# Patient Record
Sex: Male | Born: 1970 | Race: White | Hispanic: No | Smoking: Never smoker
Health system: Southern US, Community
[De-identification: ages and names within clinical notes are randomized; demographics above are authoritative.]

## PROBLEM LIST (undated history)

## (undated) DIAGNOSIS — G8929 Other chronic pain: Secondary | ICD-10-CM

## (undated) DIAGNOSIS — M4306 Spondylolysis, lumbar region: Secondary | ICD-10-CM

## (undated) DIAGNOSIS — F32A Depression, unspecified: Secondary | ICD-10-CM

## (undated) DIAGNOSIS — M8700 Idiopathic aseptic necrosis of unspecified bone: Secondary | ICD-10-CM

## (undated) DIAGNOSIS — F419 Anxiety disorder, unspecified: Secondary | ICD-10-CM

## (undated) DIAGNOSIS — N529 Male erectile dysfunction, unspecified: Secondary | ICD-10-CM

## (undated) DIAGNOSIS — I10 Essential (primary) hypertension: Secondary | ICD-10-CM

## (undated) DIAGNOSIS — K759 Inflammatory liver disease, unspecified: Secondary | ICD-10-CM

## (undated) DIAGNOSIS — M1990 Unspecified osteoarthritis, unspecified site: Secondary | ICD-10-CM

## (undated) HISTORY — PX: HIP SURGERY: SHX245

## (undated) HISTORY — DX: Anxiety disorder, unspecified: F41.9

## (undated) HISTORY — PX: BACK SURGERY: SHX140

## (undated) HISTORY — DX: Other chronic pain: G89.29

## (undated) HISTORY — DX: Spondylolysis, lumbar region: M43.06

## (undated) HISTORY — DX: Male erectile dysfunction, unspecified: N52.9

## (undated) HISTORY — PX: JOINT REPLACEMENT: SHX530

## (undated) HISTORY — DX: Depression, unspecified: F32.A

---

## 2005-02-26 ENCOUNTER — Emergency Department: Payer: Self-pay | Admitting: General Practice

## 2007-07-06 ENCOUNTER — Emergency Department: Payer: Self-pay | Admitting: Emergency Medicine

## 2009-04-28 ENCOUNTER — Emergency Department: Payer: Self-pay | Admitting: Emergency Medicine

## 2010-01-05 ENCOUNTER — Ambulatory Visit: Payer: Self-pay | Admitting: Orthopedic Surgery

## 2010-01-10 ENCOUNTER — Inpatient Hospital Stay: Payer: Self-pay | Admitting: Orthopedic Surgery

## 2011-11-23 ENCOUNTER — Emergency Department: Payer: Self-pay | Admitting: Emergency Medicine

## 2011-11-23 ENCOUNTER — Ambulatory Visit: Payer: Self-pay | Admitting: Internal Medicine

## 2011-11-23 LAB — CBC
HCT: 34.3 % — ABNORMAL LOW (ref 40.0–52.0)
HGB: 11.6 g/dL — ABNORMAL LOW (ref 13.0–18.0)
MCH: 30.9 pg (ref 26.0–34.0)
MCHC: 33.9 g/dL (ref 32.0–36.0)
MCV: 91 fL (ref 80–100)
Platelet: 350 10*3/uL (ref 150–440)
RBC: 3.76 10*6/uL — ABNORMAL LOW (ref 4.40–5.90)
RDW: 14 % (ref 11.5–14.5)
WBC: 12.7 10*3/uL — ABNORMAL HIGH (ref 3.8–10.6)

## 2011-11-23 LAB — BASIC METABOLIC PANEL
BUN: 28 mg/dL — ABNORMAL HIGH (ref 7–18)
Calcium, Total: 9.3 mg/dL (ref 8.5–10.1)
Chloride: 95 mmol/L — ABNORMAL LOW (ref 98–107)
EGFR (Non-African Amer.): >60
Osmolality: 278 (ref 275–301)
Potassium: 4 mmol/L (ref 3.5–5.1)
Sodium: 135 mmol/L — ABNORMAL LOW (ref 136–145)

## 2011-11-23 LAB — TROPONIN I: Troponin-I: <0.02 ng/mL

## 2011-12-19 ENCOUNTER — Ambulatory Visit: Payer: Self-pay | Admitting: Internal Medicine

## 2012-09-24 ENCOUNTER — Ambulatory Visit: Payer: Self-pay | Admitting: Family Medicine

## 2015-04-25 ENCOUNTER — Emergency Department
Admission: EM | Admit: 2015-04-25 | Discharge: 2015-04-25 | Disposition: A | Discharge status: Home / Self Care | Payer: Self-pay | Attending: Emergency Medicine | Admitting: Emergency Medicine

## 2015-04-25 ENCOUNTER — Encounter: Payer: Self-pay | Admitting: *Deleted

## 2015-04-25 DIAGNOSIS — F10120 Alcohol abuse with intoxication, uncomplicated: Secondary | ICD-10-CM | POA: Insufficient documentation

## 2015-04-25 DIAGNOSIS — F10920 Alcohol use, unspecified with intoxication, uncomplicated: Secondary | ICD-10-CM

## 2015-04-25 DIAGNOSIS — F1410 Cocaine abuse, uncomplicated: Secondary | ICD-10-CM | POA: Insufficient documentation

## 2015-04-25 DIAGNOSIS — F1210 Cannabis abuse, uncomplicated: Secondary | ICD-10-CM | POA: Insufficient documentation

## 2015-04-25 LAB — CBC WITH DIFFERENTIAL/PLATELET
BASOS ABS: 0.1 10*3/uL (ref 0–0.1)
BASOS PCT: 1 %
EOS ABS: 0.1 10*3/uL (ref 0–0.7)
Eosinophils Relative: 1 %
HEMATOCRIT: 48.1 % (ref 40.0–52.0)
HEMOGLOBIN: 16.6 g/dL (ref 13.0–18.0)
Lymphocytes Relative: 30 %
Lymphs Abs: 2.2 10*3/uL (ref 1.0–3.6)
MCH: 34.6 pg — ABNORMAL HIGH (ref 26.0–34.0)
MCHC: 34.5 g/dL (ref 32.0–36.0)
MCV: 100.5 fL — ABNORMAL HIGH (ref 80.0–100.0)
MONO ABS: 0.5 10*3/uL (ref 0.2–1.0)
MONOS PCT: 7 %
Neutro Abs: 4.7 10*3/uL (ref 1.4–6.5)
Neutrophils Relative %: 61 %
Platelets: 174 10*3/uL (ref 150–440)
RBC: 4.79 MIL/uL (ref 4.40–5.90)
RDW: 13.9 % (ref 11.5–14.5)
WBC: 7.6 10*3/uL (ref 3.8–10.6)

## 2015-04-25 LAB — COMPREHENSIVE METABOLIC PANEL
ALK PHOS: 74 U/L (ref 38–126)
ALT: 145 U/L — AB (ref 17–63)
ANION GAP: 14 (ref 5–15)
AST: 138 U/L — ABNORMAL HIGH (ref 15–41)
Albumin: 4.9 g/dL (ref 3.5–5.0)
BUN: 7 mg/dL (ref 6–20)
CALCIUM: 9.4 mg/dL (ref 8.9–10.3)
CO2: 26 mmol/L (ref 22–32)
Chloride: 106 mmol/L (ref 101–111)
Creatinine, Ser: 0.76 mg/dL (ref 0.61–1.24)
GFR calc Af Amer: >60 mL/min (ref >60)
GFR calc non Af Amer: >60 mL/min (ref >60)
GLUCOSE: 106 mg/dL — AB (ref 65–99)
Potassium: 3.8 mmol/L (ref 3.5–5.1)
SODIUM: 146 mmol/L — AB (ref 135–145)
Total Bilirubin: 0.4 mg/dL (ref 0.3–1.2)
Total Protein: 9.1 g/dL — ABNORMAL HIGH (ref 6.5–8.1)

## 2015-04-25 LAB — URINE DRUG SCREEN, QUALITATIVE (ARMC ONLY)
Amphetamines, Ur Screen: NOT DETECTED
BARBITURATES, UR SCREEN: NOT DETECTED
Benzodiazepine, Ur Scrn: NOT DETECTED
CANNABINOID 50 NG, UR ~~LOC~~: POSITIVE — AB
Cocaine Metabolite,Ur ~~LOC~~: POSITIVE — AB
MDMA (Ecstasy)Ur Screen: NOT DETECTED
Methadone Scn, Ur: NOT DETECTED
Opiate, Ur Screen: NOT DETECTED
Phencyclidine (PCP) Ur S: NOT DETECTED
Tricyclic, Ur Screen: NOT DETECTED

## 2015-04-25 NOTE — Discharge Instructions (Signed)
You have been seen in the Emergency Department (ED) today for alcohol intoxication and substance abuse.  He has been released from police custody and had no acute medical issues at this time.  Please return to the ED immediately if you have ANY thoughts of hurting yourself or anyone else, so that we may help you.  Please avoid alcohol and drug use.  Follow up with your doctor and/or therapist as soon as possible regarding today's ED  visit.   Please follow up any other recommendations and clinic appointments provided by the psychiatry team that saw you in the Emergency Department.   Alcohol Intoxication Alcohol intoxication occurs when you drink enough alcohol that it affects your ability to function. It can be mild or very severe. Drinking a lot of alcohol in a short time is called binge drinking. This can be very harmful. Drinking alcohol can also be more dangerous if you are taking medicines or other drugs. Some of the effects caused by alcohol may include:  Loss of coordination.  Changes in mood and behavior.  Unclear thinking.  Trouble talking (slurred speech).  Throwing up (vomiting).  Confusion.  Slowed breathing.  Twitching and shaking (seizures).  Loss of consciousness. HOME CARE  Do not drive after drinking alcohol.  Drink enough water and fluids to keep your pee (urine) clear or pale yellow. Avoid caffeine.  Only take medicine as told by your doctor. GET HELP IF:  You throw up (vomit) many times.  You do not feel better after a few days.  You frequently have alcohol intoxication. Your doctor can help decide if you should see a substance use treatment counselor. GET HELP RIGHT AWAY IF:  You become shaky when you stop drinking.  You have twitching and shaking.  You throw up blood. It may look bright red or like coffee grounds.  You notice blood in your poop (bowel movements).  You become lightheaded or pass out (faint). MAKE SURE YOU:   Understand  these instructions.  Will watch your condition.  Will get help right away if you are not doing well or get worse. Document Released: 04/02/2008 Document Revised: 06/17/2013 Document Reviewed: 03/20/2013 [facility] Patient Information 2015 [place], [place]. This information is not intended to replace advice given to you by your health care provider. Make sure you discuss any questions you have with your health care provider.  Ethanol This is a test of the blood alcohol level and is used to determine whether your level will impair your driving or other activities and also to determine the possibility of overdose (alcohol poisoning). PREPARATION FOR TEST No preparation or fasting is necessary. NORMAL FINDINGS  Negative  Possible critical values are greater than 300 mg/dl or greater than 65 mmol/L (SI units). Ranges for normal findings may vary among different laboratories and hospitals. You should always check with your doctor after having lab work or other tests done to discuss the meaning of your test results and whether your values are considered within normal limits. MEANING OF TEST  Your caregiver will go over the test results with you and discuss the importance and meaning of your results, as well as treatment options and the need for additional tests if necessary. OBTAINING THE TEST RESULTS  It is your responsibility to obtain your test results. Ask the lab or department performing the test when and how you will get your results. Document Released: 11/07/2004 Document Revised: 01/07/2012 Document Reviewed: 09/24/2008 [facility] Patient Information 2015 [place], [place]. This information is not intended  to replace advice given to you by your health care provider. Make sure you discuss any questions you have with your health care provider.  Polysubstance Abuse When people abuse more than one drug or type of drug it is called polysubstance or polydrug abuse. For example, many smokers also drink  alcohol. This is one form of polydrug abuse. Polydrug abuse also refers to the use of a drug to counteract an unpleasant effect produced by another drug. It may also be used to help with withdrawal from another drug. People who take stimulants may become agitated. Sometimes this agitation is countered with a tranquilizer. This helps protect against the unpleasant side effects. Polydrug abuse also refers to the use of different drugs at the same time.  Anytime drug use is interfering with normal living activities, it has become abuse. This includes problems with family and friends. Psychological dependence has developed when your mind tells you that the drug is needed. This is usually followed by physical dependence which has developed when continuing increases of drug are required to get the same feeling or "high". This is known as addiction or chemical dependency. A person's risk is much higher if there is a history of chemical dependency in the family. SIGNS OF CHEMICAL DEPENDENCY  You have been told by friends or family that drugs have become a problem.  You fight when using drugs.  You are having blackouts (not remembering what you do while using).  You feel sick from using drugs but continue using.  You lie about use or amounts of drugs (chemicals) used.  You need chemicals to get you going.  You are suffering in work performance or in school because of drug use.  You get sick from use of drugs but continue to use anyway.  You need drugs to relate to people or feel comfortable in social situations.  You use drugs to forget problems. "Yes" answered to any of the above signs of chemical dependency indicates there are problems. The longer the use of drugs continues, the greater the problems will become. If there is a family history of drug or alcohol use, it is best not to experiment with these drugs. Continual use leads to tolerance. After tolerance develops more of the drug is needed to  get the same feeling. This is followed by addiction. With addiction, drugs become the most important part of life. It becomes more important to take drugs than participate in the other usual activities of life. This includes relating to friends and family. Addiction is followed by dependency. Dependency is a condition where drugs are now needed not just to get high, but to feel normal. Addiction cannot be cured but it can be stopped. This often requires outside help and the care of professionals. Treatment centers are listed in the yellow pages under: Cocaine, Narcotics, and Alcoholics Anonymous. Most hospitals and clinics can refer you to a specialized care center. Talk to your caregiver if you need help. Document Released: 06/06/2005 Document Revised: 01/07/2012 Document Reviewed: 10/15/2005 [facility] Patient Information 2015 [place], [place]. This information is not intended to replace advice given to you by your health care provider. Make sure you discuss any questions you have with your health care provider.

## 2015-04-25 NOTE — ED Provider Notes (Signed)
[facility] Emergency Department Provider Note  ____________________________________________  Time seen: Approximately 5:24 AM  I have reviewed the triage vital signs and the nursing notes.   HISTORY  Chief Complaint Medical Clearance  Presumed alcohol intoxication  HPI Phillip Mcdaniel is a 44 y.o. male with history of chronic alcohol abuse and cocaine abuse who presents in police custody for medical clearance to go to jail.  He had a positive breathalyzer test and per their protocol he was brought to the emergency department.  The patient does admit to drinking earlier tonight as well as smoking crack.  He is not sure when he finished.  He drinks a large amount of alcohol every day and occasionally smokes crack.  He denies tobacco abuse.  He denies any chronic medical issues.  No complaints at this time.   History reviewed. No pertinent past medical history.  There are no active problems to display for this patient.   Past Surgical History  Procedure Laterality Date  . Joint replacement      No current outpatient prescriptions on file.  Allergies Review of patient's allergies indicates no known allergies.  History reviewed. No pertinent family history.  Social History History  Substance Use Topics  . Smoking status: Never Smoker   . Smokeless tobacco: Never Used  . Alcohol Use: Yes     Comment: daily, at least 12 beers a day.  Cocaine and marijuana abuse  Review of Systems Constitutional: No fever/chills Eyes: No visual changes. ENT: No sore throat. Cardiovascular: Denies chest pain. Respiratory: Denies shortness of breath. Gastrointestinal: No abdominal pain.  No nausea, no vomiting.  No diarrhea.  No constipation. Genitourinary: Negative for dysuria. Musculoskeletal: Negative for back pain. Skin: Negative for rash. Neurological: Negative for headaches, focal weakness or numbness.  10-point ROS otherwise  negative.  ____________________________________________   PHYSICAL EXAM:  VITAL SIGNS: ED Triage Vitals  Enc Vitals Group     BP 04/25/15 0425 166/109 mmHg     Pulse Rate 04/25/15 0425 90     Resp 04/25/15 0425 16     Temp 04/25/15 0425 97.7 F (36.5 C)     Temp Source 04/25/15 0425 Oral     SpO2 04/25/15 0425 97 %     Weight 04/25/15 0425 170 lb (77.111 kg)     Height 04/25/15 0425 5\' 6"  (1.676 m)     Head Cir --      Peak Flow --      Pain Score --      Pain Loc --      Pain Edu? --      Excl. in GC? --     Constitutional: Alert and oriented, appears mildly intoxicated but is speaking clearly and ambulating without difficulty.  Disheveled but generally well-appearing and in no acute distress. Eyes: Conjunctivae are normal. PERRL. EOMI. Head: Atraumatic. Nose: No congestion/rhinnorhea. Mouth/Throat: Mucous membranes are moist.  Oropharynx non-erythematous. Neck: No stridor.   Cardiovascular: Normal rate, regular rhythm. Grossly normal heart sounds.  Good peripheral circulation. Respiratory: Normal respiratory effort.  No retractions. Lungs CTAB. Gastrointestinal: Soft and nontender. No distention. No abdominal bruits. No CVA tenderness. Musculoskeletal: No lower extremity tenderness nor edema.  No joint effusions. Neurologic:  Normal speech and language. No gross focal neurologic deficits are appreciated. Speech is normal. Skin:  Skin is warm, dry and intact. No rash noted. Psychiatric: Mood and affect are normal. Speech and behavior are normal.  Denies SI/HI  ____________________________________________   LABS (all labs ordered  are listed, but only abnormal results are displayed)  Labs Reviewed  CBC WITH DIFFERENTIAL/PLATELET - Abnormal; Notable for the following:    MCV 100.5 (*)    MCH 34.6 (*)    All other components within normal limits  COMPREHENSIVE METABOLIC PANEL - Abnormal; Notable for the following:    Sodium 146 (*)    Glucose, Bld 106 (*)    Total  Protein 9.1 (*)    AST 138 (*)    ALT 145 (*)    All other components within normal limits  URINE DRUG SCREEN, QUALITATIVE ([facility] ONLY) - Abnormal; Notable for the following:    Cocaine Metabolite,Ur [place] POSITIVE (*)    Cannabinoid 50 Ng, Ur [place] POSITIVE (*)    All other components within normal limits   ____________________________________________  EKG  Not indicated ____________________________________________  RADIOLOGY  Not indicated  ____________________________________________   PROCEDURES  Procedure(s) performed: None  Critical Care performed: No ____________________________________________   INITIAL IMPRESSION / ASSESSMENT AND PLAN / ED COURSE  Pertinent labs & imaging results that were available during my care of the patient were reviewed by me and considered in my medical decision making (see chart for details).  While speaking with the police officers outside of the patient's room, one of the officers received word that the patient has been released from their custody.  The patient is clinically sober given that he is alert and oriented 3, walks with no difficulty, and is completely appropriate during her verbal interactions.  He has a chronic alcoholic and I see little benefit to checking an alcohol level as long as he leaves in the custody of a responsible, sober adult.  The police officers have called his girlfriend, June, who is coming to pick him up.  He is medically cleared and needs no additional care at this time.  ____________________________________________  FINAL CLINICAL IMPRESSION(S) / ED DIAGNOSES  Final diagnoses:  Alcohol intoxication, uncomplicated      NEW MEDICATIONS STARTED DURING THIS VISIT:  New Prescriptions   No medications on file     Loleta Rose, MD 04/25/15 [phone]

## 2015-04-25 NOTE — ED Notes (Signed)
Pt presents in custody of police. Pt needs medical clearance to go to jail because his ETOH measured at the jail was 33.

## 2015-04-25 NOTE — ED Notes (Signed)
Patient brought in by [place] [employer] to be cleared for jail. [place] [facility] [place] not [place] him if his alcohol level is to high.

## 2018-10-24 ENCOUNTER — Emergency Department (HOSPITAL_COMMUNITY): Admission: EM | Admit: 2018-10-24 | Discharge: 2018-10-24 | Discharge status: Absent without leave | Payer: Self-pay

## 2019-01-22 ENCOUNTER — Ambulatory Visit
Admission: EM | Admit: 2019-01-22 | Discharge: 2019-01-22 | Disposition: A | Discharge status: Home / Self Care | Payer: Self-pay | Attending: Family Medicine | Admitting: Family Medicine

## 2019-01-22 ENCOUNTER — Other Ambulatory Visit: Payer: Self-pay

## 2019-01-22 ENCOUNTER — Encounter: Payer: Self-pay | Admitting: Emergency Medicine

## 2019-01-22 DIAGNOSIS — L219 Seborrheic dermatitis, unspecified: Secondary | ICD-10-CM

## 2019-01-22 DIAGNOSIS — J988 Other specified respiratory disorders: Secondary | ICD-10-CM

## 2019-01-22 DIAGNOSIS — B9789 Other viral agents as the cause of diseases classified elsewhere: Secondary | ICD-10-CM

## 2019-01-22 HISTORY — DX: Essential (primary) hypertension: I10

## 2019-01-22 MED ORDER — KETOCONAZOLE 2 % EX CREA: 1.0000 "application " | TOPICAL_CREAM | Freq: Two times a day (BID) | CUTANEOUS | 0 refills | Status: DC

## 2019-01-22 MED ORDER — IPRATROPIUM BROMIDE 0.06 % NA SOLN: 2.0000 | Freq: Four times a day (QID) | NASAL | 0 refills | Status: DC | PRN

## 2019-01-22 MED ORDER — KETOCONAZOLE 2 % EX SHAM: MEDICATED_SHAMPOO | CUTANEOUS | 0 refills | Status: DC

## 2019-01-22 MED ORDER — BENZONATATE 200 MG PO CAPS: 200.0000 mg | ORAL_CAPSULE | Freq: Three times a day (TID) | ORAL | 0 refills | Status: DC | PRN

## 2019-01-22 NOTE — ED Provider Notes (Signed)
[facility]    CSN: [id number] Arrival date & time: 01/22/19  1635  History   Chief Complaint Chief Complaint  Patient presents with  . Cough  . Nasal Congestion    HPI   48 year old male presents with cough and congestion.  Patient reports that his symptoms started earlier this week.  He states that they really started on Tuesday or Wednesday of this week.  Denies fever.  Reports productive cough and congestion.  He has been taking DayQuil and NyQuil without resolution.  Wife has recently had the flu.  He has had no documented fever.  Patient also notes that he has a chronic, ongoing rash of his face.  Affects predominantly the area around the nose, the eyebrows, and the beard.  Rate.  Dry.  No other associated symptoms.  No known exacerbating factors.  No other complaints.  History reviewed and updated as below.  Past Medical History:  Diagnosis Date  . Hypertension    Past Surgical History:  Procedure Laterality Date  . JOINT REPLACEMENT      Home Medications    Prior to Admission medications   Medication Sig Start Date End Date Taking? Authorizing Provider  benzonatate (TESSALON) 200 MG capsule Take 1 capsule (200 mg total) by mouth 3 (three) times daily as needed for cough. 01/22/19   Everlene Other G, DO  ipratropium (ATROVENT) 0.06 % nasal spray Place 2 sprays into both nostrils 4 (four) times daily as needed for rhinitis. 01/22/19   Tommie Sams, DO  ketoconazole (NIZORAL) 2 % cream Apply 1 application topically 2 (two) times daily. 01/22/19   Tommie Sams, DO  ketoconazole (NIZORAL) 2 % shampoo Daily until improvement, then once weekly. 01/22/19   Tommie Sams, DO    Social History Social History   Tobacco Use  . Smoking status: Never Smoker  . Smokeless tobacco: Never Used  Substance Use Topics  . Alcohol use: Yes    Comment: daily, at least 12 beers a day.  . Drug use: Not Currently    Types: Cocaine, Marijuana    Comment: 2 days ago      Allergies   Patient has no known allergies.   Review of Systems Review of Systems  HENT: Positive for congestion.   Respiratory: Positive for cough.   Skin: Positive for rash.   Physical Exam Triage Vital Signs ED Triage Vitals  Enc Vitals Group     BP 01/22/19 1647 (!) 170/139     Pulse Rate 01/22/19 1647 88     Resp 01/22/19 1647 18     Temp 01/22/19 1647 98.4 F (36.9 C)     Temp Source 01/22/19 1647 Oral     SpO2 01/22/19 1647 99 %     Weight 01/22/19 1645 170 lb (77.1 kg)     Height 01/22/19 1645 5\' 6"  (1.676 m)     Head Circumference --      Peak Flow --      Pain Score 01/22/19 1645 0     Pain Loc --      Pain Edu? --      Excl. in GC? --    Updated Vital Signs BP (!) 170/139 (BP Location: Right Arm)   Pulse 88   Temp 98.4 F (36.9 C) (Oral)   Resp 18   Ht 5\' 6"  (1.676 m)   Wt 77.1 kg   SpO2 99%   BMI 27.44 kg/m   Visual Acuity Right Eye Distance:  Left Eye Distance:   Bilateral Distance:    Right Eye Near:   Left Eye Near:    Bilateral Near:     Physical Exam Vitals signs and nursing note reviewed.  Constitutional:      General: He is not in acute distress.    Appearance: Normal appearance.  HENT:     Head: Normocephalic and atraumatic.      Comments: Erythematous, dry rash at the labeled locations.    Nose: Congestion present.     Mouth/Throat:     Pharynx: Oropharynx is clear. No oropharyngeal exudate or posterior oropharyngeal erythema.  Eyes:     General:        Right eye: No discharge.        Left eye: No discharge.     Conjunctiva/sclera: Conjunctivae normal.  Cardiovascular:     Rate and Rhythm: Normal rate and regular rhythm.  Pulmonary:     Effort: Pulmonary effort is normal.     Breath sounds: Normal breath sounds.  Neurological:     Mental Status: He is alert.  Psychiatric:        Mood and Affect: Mood normal.        Behavior: Behavior normal.      UC Treatments / Results  Labs (all labs ordered are listed, but  only abnormal results are displayed) Labs Reviewed - No data to display  EKG None  Radiology No results found.  Procedures Procedures (including critical care time)  Medications Ordered in UC Medications - No data to display  Initial Impression / Assessment and Plan / UC Course  I have reviewed the triage vital signs and the nursing notes.  Pertinent labs & imaging results that were available during my care of the patient were reviewed by me and considered in my medical decision making (see chart for details).    48 year old male presents with a viral respiratory infection.  Tessalon Perles and Atrovent nasal spray as prescribed.  Patient's rash appears to be most consistent with seborrheic dermatitis.  Treating with ketoconazole.  Final Clinical Impressions(s) / UC Diagnoses   Final diagnoses:  Respiratory infection  Seborrheic dermatitis     Discharge Instructions     Medications as prescribed.  Take care  Dr. Adriana Simas    ED Prescriptions    Medication Sig Dispense Auth. Provider   benzonatate (TESSALON) 200 MG capsule Take 1 capsule (200 mg total) by mouth 3 (three) times daily as needed for cough. 30 capsule Jamaurion Slemmer G, DO   ipratropium (ATROVENT) 0.06 % nasal spray Place 2 sprays into both nostrils 4 (four) times daily as needed for rhinitis. 15 mL Delquan Poucher G, DO   ketoconazole (NIZORAL) 2 % cream Apply 1 application topically 2 (two) times daily. 60 g Keeyon Privitera G, DO   ketoconazole (NIZORAL) 2 % shampoo Daily until improvement, then once weekly. 120 mL Tommie Sams, DO     Controlled Substance Prescriptions [place] Controlled Substance Registry consulted? Not Applicable   Tommie Sams, DO 01/22/19 1733

## 2019-01-22 NOTE — ED Triage Notes (Signed)
Patient c/o cough, nasal congestion and chills that started 1 week ago. Patient has been taken OTC Dayquil and Nyquil for his symptoms.  Wife diagnosed with the flu last week

## 2019-01-22 NOTE — Discharge Instructions (Signed)
Medications as prescribed. ° °Take care ° °Dr. Odessa Nishi  °

## 2019-06-23 ENCOUNTER — Other Ambulatory Visit: Payer: Self-pay

## 2019-06-23 DIAGNOSIS — Z20822 Contact with and (suspected) exposure to covid-19: Secondary | ICD-10-CM

## 2019-06-24 LAB — NOVEL CORONAVIRUS, NAA: SARS-CoV-2, NAA: NOT DETECTED

## 2019-06-25 ENCOUNTER — Telehealth: Payer: Self-pay | Admitting: General Practice

## 2019-06-25 NOTE — Telephone Encounter (Signed)
Negative COVID results given. Patient results "NOT Detected." Caller expressed understanding. ° °

## 2020-03-18 ENCOUNTER — Encounter: Payer: Self-pay | Admitting: Emergency Medicine

## 2020-03-18 ENCOUNTER — Ambulatory Visit (INDEPENDENT_AMBULATORY_CARE_PROVIDER_SITE_OTHER): Payer: Self-pay

## 2020-03-18 ENCOUNTER — Other Ambulatory Visit: Payer: Self-pay

## 2020-03-18 ENCOUNTER — Ambulatory Visit
Admission: EM | Admit: 2020-03-18 | Discharge: 2020-03-18 | Disposition: A | Discharge status: Home / Self Care | Payer: Self-pay | Attending: Urgent Care | Admitting: Urgent Care

## 2020-03-18 DIAGNOSIS — M5441 Lumbago with sciatica, right side: Secondary | ICD-10-CM

## 2020-03-18 DIAGNOSIS — M25559 Pain in unspecified hip: Secondary | ICD-10-CM

## 2020-03-18 DIAGNOSIS — G8929 Other chronic pain: Secondary | ICD-10-CM

## 2020-03-18 HISTORY — DX: Idiopathic aseptic necrosis of unspecified bone: M87.00

## 2020-03-18 MED ORDER — KETOROLAC TROMETHAMINE 60 MG/2ML IM SOLN
60.0000 mg | Freq: Once | INTRAMUSCULAR | Status: AC
Start: 2020-03-18 — End: 2020-03-18
  Administered 2020-03-18: 60 mg via INTRAMUSCULAR

## 2020-03-18 MED ORDER — HYDROCODONE-ACETAMINOPHEN 5-325 MG PO TABS: 1.0000 | ORAL_TABLET | Freq: Three times a day (TID) | ORAL | 0 refills | Status: DC | PRN

## 2020-03-18 MED ORDER — METHYLPREDNISOLONE 4 MG PO TBPK
ORAL_TABLET | ORAL | 0 refills | Status: DC
Start: 2020-03-18 — End: 2020-05-03

## 2020-03-18 MED ORDER — DEXAMETHASONE SODIUM PHOSPHATE 10 MG/ML IJ SOLN
10.0000 mg | Freq: Once | INTRAMUSCULAR | Status: AC
  Administered 2020-03-18: 10 mg via INTRAMUSCULAR

## 2020-03-18 NOTE — Discharge Instructions (Signed)
It was very nice seeing you today in clinic. Thank you for entrusting me with your care.   Rest; avoid strenuous activity for the next several days. Stretch as tolerated. Apply heat and/or ice to help with pain.   Make arrangements to follow up with your regular doctor in 1 week for re-evaluation if not improving. If your symptoms/condition worsens, please seek follow up care either here or in the ER. Please remember, our [facility] providers are "right here with you" when you need [place].   Again, it was my pleasure to take care of you today. Thank you for choosing our clinic. I hope that you start to feel better quickly.   Quentin Mulling, MSN, APRN, FNP-C, CEN Advanced Practice Provider [facility]

## 2020-03-18 NOTE — ED Triage Notes (Signed)
Patient c/o pain in his right hip for the past 3 weeks.  Patient states that the pain radiates to his right buttock. Patient denies injury or fall.

## 2020-03-19 ENCOUNTER — Encounter: Payer: Self-pay | Admitting: Urgent Care

## 2020-03-19 NOTE — ED Provider Notes (Signed)
[place], [place]   Name: Phillip Mcdaniel DOB: 08/31/1971 MRN: [id number] CSN: [id number] PCP: Patient, No Pcp Per  Arrival date and time:  03/18/20 2947  Chief Complaint:  Back Pain  NOTE: Prior to seeing the patient today, I have reviewed the triage nursing documentation and vital signs. Clinical staff has updated patient's PMH/PSHx, current medication list, and drug allergies/intolerances to ensure comprehensive history available to assist in medical decision making.   History:   HPI: Phillip Mcdaniel is a 49 y.o. male who presents today with complaints of pain in the RIGHT side of his lower back and RIGHT hip. PMH (+) hip replacement and AVN in his RIGHT hip. Pain present to varying degrees x 3 weeks. Patient denies injury. Patient works a physically demanding job that requires him to do lots of lifting, bending, and stooping. He has not experienced any concurrent urinary symptoms. Patient confirms radiation of his pain into his RIGHT lower extremity; posterior thigh cultimating just superior to the knee. He has not appreciated any weakness in his legs. Patient denies any acute issues with bowel or bladder incontinence. No saddle anesthesia. Patient notes that pain is exacerbated by prolonged sitting/standing, bending, stooping, torso twisting, and heavy lifting. He has attempted conservative management at home using Goody powders, which has done little to improve his symptoms. PMH does not include any previous significant back injuries or surgeries. He presents hypertensive and tachycardic secondary to his pain. He is observed in the floor hyperventilating upon my initial entrance to the room.   Past Medical History:  Diagnosis Date  . AVN (avascular necrosis of bone) ([place])    RIGHT hip  . Hypertension     Past Surgical History:  Procedure Laterality Date  . HIP SURGERY     RIGHT hip replacement    History reviewed. No pertinent family history.  Social History   Tobacco Use  .  Smoking status: Never Smoker  . Smokeless tobacco: Never Used  Substance Use Topics  . Alcohol use: Yes    Comment: daily, at least 12 beers a day.  . Drug use: Not Currently    Types: Cocaine, Marijuana    Comment: 2 days ago    There are no problems to display for this patient.   Home Medications:    No outpatient medications have been marked as taking for the 03/18/20 encounter [facility] Encounter).    Allergies:   Patient has no known allergies.  Review of Systems (ROS):  Review of systems NEGATIVE unless otherwise noted in narrative H&P section.   Vital Signs: Today's Vitals   03/18/20 1017 03/18/20 1018 03/18/20 1020 03/18/20 1143  BP:   (S) (!) 170/104   Pulse:   (!) 113   Resp:   16   Temp:   98.6 F (37 C)   TempSrc:   Oral   SpO2:   98%   Weight:  170 lb (77.1 kg)    Height:  5\' 6"  (1.676 m)    PainSc: 10-Worst pain ever   7     Physical Exam: Physical Exam  Constitutional: He is oriented to person, place, and time. He appears distressed (2/2 pain).  HENT:  Head: Normocephalic and atraumatic.  Eyes: Pupils are equal, round, and reactive to light.  Cardiovascular: Regular rhythm, normal heart sounds and intact distal pulses. Tachycardia present.  Pulmonary/Chest: Effort normal and breath sounds normal. Tachypnea noted.  Musculoskeletal:     Lumbar back: Pain and tenderness present. No swelling, deformity, lacerations  or spasms. Decreased range of motion (2/2 acute pain). Normal pulse.       Back:     Right hip: No swelling, deformity, tenderness or crepitus. Decreased range of motion. Normal strength.     Comments: No midline neck/back pain or gross deformities. (+) PMS noted distally; normal color, temperature, and capillary refill.  Neurological: He is alert and oriented to person, place, and time. He has normal sensation, normal strength and normal reflexes. He has an abnormal Straight Leg Raise Test. Gait (2/2 acute pain) abnormal.  Skin: Skin is  warm and dry. No rash noted. He is not diaphoretic.  Psychiatric: Memory, affect and judgment normal. His mood appears anxious.  Nursing note and vitals reviewed.   Urgent Care Treatments / Results:   Orders Placed This Encounter  Procedures  . DG Lumbar Spine Complete  . DG Hip Unilat W or Wo Pelvis 2-3 Views Right    LABS: PLEASE NOTE: all labs that were ordered this encounter are listed, however only abnormal results are displayed. Labs Reviewed - No data to display  EKG: -None  RADIOLOGY: DG Lumbar Spine Complete  Result Date: 03/18/2020 CLINICAL DATA:  Pain in right lower back with radiation into hip and into right lower extremity, status post total knee replacement on the right, past medical history of AVN. Additional history provided by technologist: Patient reports pain in right hip for the past 3 weeks, radiates to right buttock. EXAM: LUMBAR SPINE - COMPLETE 4+ VIEW COMPARISON:  No pertinent prior studies available for comparison. FINDINGS: Five lumbar vertebrae. Thoracolumbar dextrocurvature. Minimal L1-L2, L2-L3, L3-L4 and L4-L5 grade 1 anterolisthesis. No lumbar compression fracture. Multilevel disc space narrowing. Most notably there is moderate disc space narrowing at L5-S1. Lower lumbar facet arthrosis. No convincing pars interarticularis defect. Partially imaged right hip arthroplasty. IMPRESSION: No lumbar compression fracture. Moderate L5-S1 disc space narrowing. No more than mild disc space narrowing at the remaining levels. Lower lumbar facet arthrosis. Thoracolumbar dextrocurvature. Mild multilevel grade 1 retrolisthesis. Electronically Signed   By: Jackey Loge DO   On: 03/18/2020 11:26   DG Hip Unilat W or Wo Pelvis 2-3 Views Right  Result Date: 03/18/2020 CLINICAL DATA:  Right hip, low back, and leg pain for 3 weeks. History of AVN and right hip replacement. EXAM: DG HIP (WITH OR WITHOUT PELVIS) 2-3V RIGHT COMPARISON:  01/10/2010 FINDINGS: Sequelae of right total  hip arthroplasty are again identified. No acute fracture or dislocation is identified. There is generalized osteopenia about the acetabular and proximal femoral components without focal erosion or endosteal scalloping. Limited assessment of the left hip demonstrates mild axial joint space narrowing and patchy sclerosis in the left femoral head without evidence of collapse. IMPRESSION: 1. Right total hip arthroplasty without evidence of acute osseous abnormality. 2. Sclerosis in the left femoral head suggesting avascular necrosis without collapse. Electronically Signed   By: Sebastian Ache M.D.   On: 03/18/2020 11:28    PROCEDURES: Procedures  MEDICATIONS RECEIVED THIS VISIT: Medications  dexamethasone (DECADRON) injection 10 mg (10 mg Intramuscular Given 03/18/20 1046)  ketorolac (TORADOL) injection 60 mg (60 mg Intramuscular Given 03/18/20 1046)    PERTINENT CLINICAL COURSE NOTES/UPDATES:   Initial Impression / Assessment and Plan / Urgent Care Course:  Pertinent labs & imaging results that were available during my care of the patient were personally reviewed by me and considered in my medical decision making (see lab/imaging section of note for values and interpretations).  Phillip Mcdaniel is a 49 y.o.  male who presents to [facility] today with complaints of Back Pain  Patient is well appearing overall in clinic today. He does not appear to be acute distress related to his pain. Presenting symptoms (see HPI) and exam as documented above. Treated in clinic with ketorolac 60 mg and dexamethasone 10 mg IM injections, which were effective in reducing patient's pain from a 10/10 to a 7/10; appears much more comfortable. Radiographs of the lumbar spine and RIGHT hip performed. Spine films reveal moderate multi-level disc space narrowing and lower lumbar facet arthrosis; no compression fractures. Films of the RIGHT hip reveal no fracture or dislocation; (+) AVN noted with no evidence of collapse.  Will continue pain control at home using systemic steroids (Medrol) that patient will start tomorrow. May supplement with APAP as needed; asked to avoid NSAIDs while on steroids due to risk of blood pressure elevations. Reviewed supportive care measures; heat/ice, daily stretching. Pain noted to be significant and affecting sleeping and overall. Sending home with a short course of Norco for PRN use; indications and side effects reviewed. If not improving, patient to discuss referral to orthopedics and/or neurosurgery with PCP; may require advanced MRI imaging to further assess lumbar spine.   Current clinical condition warrants patient being out of work in order to recover from his current injury/illness. He was provided with the appropriate documentation to provide to his place of employment that will allow for him to RTW on 03/21/2020 with no restrictions.   Discussed follow up with primary care physician in 1 week for re-evaluation. I have reviewed the follow up and strict return precautions for any new or worsening symptoms. Patient is aware of symptoms that would be deemed urgent/emergent, and would thus require further evaluation either here or in the emergency department. At the time of discharge, he verbalized understanding and consent with the discharge plan as it was reviewed with him. All questions were fielded by provider and/or clinic staff prior to patient discharge.    Final Clinical Impressions / Urgent Care Diagnoses:   Final diagnoses:  Chronic right-sided low back pain with right-sided sciatica  Hip pain    New Prescriptions:  [place] Controlled Substance Registry consulted? Yes, I have consulted the [place] Controlled Substances Registry for this patient, and feel the risk/benefit ratio today is favorable for proceeding with this prescription for a controlled substance.  . Discussed use of controlled substance medication to treat his acute pain.  o Reviewed [place] STOP Act regulations   o Clinic does not refill controlled substances over the phone without face to face evaluation.  . Safety precautions reviewed.  o Medications should not be bitten, chewed, sold, or taken with alcohol.  o Avoid use while working, driving, or operating heavy machinery.  o Side effects associated with the use of this particular medication reviewed. - Patient understands that this medication can cause CNS depression, increase his risk of falls, and even lead to overdose that may result in death, if used outside of the parameters that he and I discussed.  With all of this in mind, he knowingly accepts the risks and responsibilities associated with intended course of treatment, and elects to responsibly proceed as discussed.  Meds ordered this encounter  Medications  . dexamethasone (DECADRON) injection 10 mg  . ketorolac (TORADOL) injection 60 mg  . methylPREDNISolone (MEDROL DOSEPAK) 4 MG TBPK tablet    Sig: Take by mouth daily - taper daily dose per package instructions.    Dispense:  21 tablet  Refill:  0  . HYDROcodone-acetaminophen (NORCO) 5-325 MG tablet    Sig: Take 1 tablet by mouth 3 (three) times daily as needed for moderate pain.    Dispense:  12 tablet    Refill:  0    Recommended Follow up Care:  Patient encouraged to follow up with the following provider within the specified time frame, or sooner as dictated by the severity of his symptoms. As always, he was instructed that for any urgent/emergent care needs, he should seek care either here or in the emergency department for more immediate evaluation.  Follow-up Information    PCP In 1 week.   Why: General reassessment of symptoms if not improving        NOTE: This note was prepared using Scientist, clinical (histocompatibility and immunogenetics) along with smaller Lobbyist. Despite my best ability to proofread, there is the potential that transcriptional errors may still occur from this process, and are completely unintentional.    Verlee Monte, NP 03/19/20 1857

## 2020-05-02 ENCOUNTER — Other Ambulatory Visit: Payer: Self-pay

## 2020-05-02 DIAGNOSIS — F329 Major depressive disorder, single episode, unspecified: Secondary | ICD-10-CM | POA: Insufficient documentation

## 2020-05-02 DIAGNOSIS — Z20822 Contact with and (suspected) exposure to covid-19: Secondary | ICD-10-CM | POA: Insufficient documentation

## 2020-05-02 DIAGNOSIS — I10 Essential (primary) hypertension: Secondary | ICD-10-CM | POA: Insufficient documentation

## 2020-05-02 DIAGNOSIS — F1910 Other psychoactive substance abuse, uncomplicated: Secondary | ICD-10-CM | POA: Insufficient documentation

## 2020-05-02 DIAGNOSIS — F600 Paranoid personality disorder: Secondary | ICD-10-CM | POA: Insufficient documentation

## 2020-05-02 DIAGNOSIS — R443 Hallucinations, unspecified: Secondary | ICD-10-CM | POA: Insufficient documentation

## 2020-05-02 LAB — CBC
HCT: 44 % (ref 39.0–52.0)
Hemoglobin: 15.8 g/dL (ref 13.0–17.0)
MCH: 34.5 pg — ABNORMAL HIGH (ref 26.0–34.0)
MCHC: 35.9 g/dL (ref 30.0–36.0)
MCV: 96.1 fL (ref 80.0–100.0)
Platelets: 221 10*3/uL (ref 150–400)
RBC: 4.58 MIL/uL (ref 4.22–5.81)
RDW: 12.7 % (ref 11.5–15.5)
WBC: 8.1 10*3/uL (ref 4.0–10.5)
nRBC: 0 % (ref 0.0–0.2)

## 2020-05-02 LAB — ETHANOL: Alcohol, Ethyl (B): 58 mg/dL — ABNORMAL HIGH (ref <10)

## 2020-05-02 NOTE — ED Triage Notes (Signed)
Pt arrives to ED via [facility] under IVC for "methamphetamine, cocaine, and fentanyl" use. Pt is also an "alcoholic", "paranoid", and hallucinating. Pt is alert, hyperactive; in NAD. RR even, regular, and unlabored.

## 2020-05-02 NOTE — ED Notes (Signed)
Pt belongings include: 1 gray t-shirt, 1 pair tan pants, 1 pair white tennis shoes, 1 pair white socks, 1 pair gray boxers. No cell phone, wallet, cash, jewelry, or other valuables. Belongings in one bag and labeled per policy.

## 2020-05-03 ENCOUNTER — Emergency Department
Admission: EM | Admit: 2020-05-03 | Discharge: 2020-05-04 | Disposition: A | Discharge status: Home / Self Care | Payer: Self-pay | Attending: Emergency Medicine | Admitting: Emergency Medicine

## 2020-05-03 DIAGNOSIS — F32A Depression, unspecified: Secondary | ICD-10-CM | POA: Diagnosis present

## 2020-05-03 DIAGNOSIS — F1910 Other psychoactive substance abuse, uncomplicated: Secondary | ICD-10-CM

## 2020-05-03 DIAGNOSIS — F19959 Other psychoactive substance use, unspecified with psychoactive substance-induced psychotic disorder, unspecified: Secondary | ICD-10-CM

## 2020-05-03 LAB — COMPREHENSIVE METABOLIC PANEL
ALT: 105 U/L — ABNORMAL HIGH (ref 0–44)
AST: 122 U/L — ABNORMAL HIGH (ref 15–41)
Albumin: 4.4 g/dL (ref 3.5–5.0)
Alkaline Phosphatase: 84 U/L (ref 38–126)
Anion gap: 14 (ref 5–15)
BUN: 13 mg/dL (ref 6–20)
CO2: 26 mmol/L (ref 22–32)
Calcium: 9.7 mg/dL (ref 8.9–10.3)
Chloride: 97 mmol/L — ABNORMAL LOW (ref 98–111)
Creatinine, Ser: 1.16 mg/dL (ref 0.61–1.24)
GFR calc Af Amer: >60 mL/min (ref >60)
GFR calc non Af Amer: >60 mL/min (ref >60)
Glucose, Bld: 112 mg/dL — ABNORMAL HIGH (ref 70–99)
Potassium: 3.8 mmol/L (ref 3.5–5.1)
Sodium: 137 mmol/L (ref 135–145)
Total Bilirubin: 1.8 mg/dL — ABNORMAL HIGH (ref 0.3–1.2)
Total Protein: 8.2 g/dL — ABNORMAL HIGH (ref 6.5–8.1)

## 2020-05-03 LAB — URINE DRUG SCREEN, QUALITATIVE (ARMC ONLY)
Amphetamines, Ur Screen: POSITIVE — AB
Barbiturates, Ur Screen: NOT DETECTED
Benzodiazepine, Ur Scrn: NOT DETECTED
Cannabinoid 50 Ng, Ur ~~LOC~~: NOT DETECTED
Cocaine Metabolite,Ur ~~LOC~~: POSITIVE — AB
MDMA (Ecstasy)Ur Screen: NOT DETECTED
Methadone Scn, Ur: NOT DETECTED
Opiate, Ur Screen: NOT DETECTED
Phencyclidine (PCP) Ur S: NOT DETECTED
Tricyclic, Ur Screen: NOT DETECTED

## 2020-05-03 LAB — SARS CORONAVIRUS 2 BY RT PCR (HOSPITAL ORDER, PERFORMED IN ~~LOC~~ HOSPITAL LAB): SARS Coronavirus 2: NEGATIVE

## 2020-05-03 MED ORDER — LORAZEPAM 2 MG PO TABS
2.0000 mg | ORAL_TABLET | Freq: Once | ORAL | Status: AC
  Administered 2020-05-03: 2 mg via ORAL
  Filled 2020-05-03: qty 1

## 2020-05-03 MED ORDER — TRAZODONE HCL 50 MG PO TABS
50.0000 mg | ORAL_TABLET | Freq: Every evening | ORAL | Status: DC | PRN
  Administered 2020-05-03: 50 mg via ORAL
  Filled 2020-05-03: qty 1

## 2020-05-03 MED ORDER — IBUPROFEN 600 MG PO TABS
600.0000 mg | ORAL_TABLET | Freq: Four times a day (QID) | ORAL | Status: DC | PRN
  Administered 2020-05-03 – 2020-05-04 (×2): 600 mg via ORAL
  Filled 2020-05-03 (×2): qty 1

## 2020-05-03 NOTE — ED Provider Notes (Signed)
@  [facility]AMP@  Blood pressure (!) 153/110, pulse 85, temperature 97.6 F (36.4 C), temperature source Oral, resp. rate 20, height 5\' 8"  (1.727 m), weight 74.8 kg, SpO2 100 %.  Patient is now having some hallucinations.  He is thinking that the TV is on is in his room and asking to have the noise turned down.  Patient is otherwise doing okay.  We will give him some Ativan to start with to see if this will help.  Otherwise we will have to give him an antipsychotic.  There have been no acute events since the last update.  Awaiting disposition plan from Behavioral Medicine team.   , MD 05/03/20 [phone]

## 2020-05-03 NOTE — ED Notes (Signed)
Pt to nurses station saying he can't take it. States the tv is too loud, tv not on in his room or dayroom. Pt observed shaking his head and pulling at this hair. States he needs something to calm him down and help his thoughts. Pt with long history of SA. Given Ativan PRN per orders.
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
## 2020-05-03 NOTE — ED Notes (Signed)
Pt brought into ED BHU via sally port and wand with metal detector for safety by [place] Security officer. Patient oriented to unit/care area: Pt informed of unit policies and procedures.  Informed that, for their safety, care areas are designed for safety and monitored by security cameras at all times. Patient verbalizes understanding, and verbal contract for safety obtained.Pt shown to their room.  

## 2020-05-03 NOTE — ED Notes (Addendum)
Pt denies SI/HI/AVH on assessment. Advised he has been referred out for SA treatment and is awaiting bed availability and pt verbalized understanding.

## 2020-05-03 NOTE — ED Provider Notes (Signed)
@  [facility]EDDAT[facility]IMESTAMP@  Blood pressure (!) 143/88, pulse 94, temperature 98.3 F (36.8 C), temperature source Oral, resp. rate 18, height 5\' 8"  (1.727 m), weight 74.8 kg, SpO2 97 %. Patient is laying in bed sleeping.  They are breathing normally calm and comfortable in no distress   There have been no acute events since the last update.  Awaiting disposition plan from Behavioral Medicine team.   , MD 05/03/20 [phone]

## 2020-05-03 NOTE — ED Notes (Addendum)
Writer faxed out patients information to the following facilities:   [facility]

## 2020-05-03 NOTE — ED Notes (Signed)
Pt. Alert and oriented, warm and dry, in no distress. Pt. Denies SI, HI, and AVH. Pt states wife IVC'ed him cause he has been using drugs. Patient states he went to [facility] and used meth his son had and did not like the effect so he got some around in [facility] and tried it and it was better but did not like how it keep him awake and so he used some cocaine and was able to score some heroin. Patient states his wife came over and brought police and he was IVC'ed. Patient states "I was going to get help on my own but now I am here." Pt. Encouraged to let nursing staff know of any concerns or needs.

## 2020-05-03 NOTE — ED Provider Notes (Signed)
[facility] Emergency Department Provider Note   ____________________________________________   First MD Initiated Contact with Patient 05/03/20 0125     (approximate)  I have reviewed the triage vital signs and the nursing notes.   HISTORY  Chief Complaint Psychiatric Evaluation    HPI LONG BRIMAGE is a 49 y.o. male brought to the ED under IVC for substance use, paranoia and hallucinations.  Patient endorses using cocaine, methamphetamine, alcohol and fentanyl.  Reportedly he was placed to go to rehab this morning but his wife came over with police and IVC'd him.  Denies active SI/HI/AH/VH.  Voices no medical complaints.       Past Medical History:  Diagnosis Date  . AVN (avascular necrosis of bone) (HCC)    RIGHT hip  . Hypertension     Patient Active Problem List   Diagnosis Date Noted  . Polysubstance abuse (HCC) 05/03/2020  . Depression 05/03/2020    Past Surgical History:  Procedure Laterality Date  . HIP SURGERY     RIGHT hip replacement    Prior to Admission medications   Medication Sig Start Date End Date Taking? Authorizing Provider  ipratropium (ATROVENT) 0.06 % nasal spray Place 2 sprays into both nostrils 4 (four) times daily as needed for rhinitis. 01/22/19 03/18/20  Tommie Sams, DO    Allergies Patient has no known allergies.  No family history on file.  Social History Social History   Tobacco Use  . Smoking status: Never Smoker  . Smokeless tobacco: Never Used  Vaping Use  . Vaping Use: Never used  Substance Use Topics  . Alcohol use: Yes    Comment: daily, at least 12 beers a day.  . Drug use: Not Currently    Types: Cocaine, Marijuana    Comment: 2 days ago    Review of Systems  Constitutional: No fever/chills Eyes: No visual changes. ENT: No sore throat. Cardiovascular: Denies chest pain. Respiratory: Denies shortness of breath. Gastrointestinal: No abdominal pain.  No nausea, no vomiting.  No  diarrhea.  No constipation. Genitourinary: Negative for dysuria. Musculoskeletal: Negative for back pain. Skin: Negative for rash. Neurological: Negative for headaches, focal weakness or numbness. Psychiatric:  Positive for substance abuse.  ____________________________________________   PHYSICAL EXAM:  VITAL SIGNS: ED Triage Vitals  Enc Vitals Group     BP 05/02/20 2317 (!) 136/97     Pulse Rate 05/02/20 2317 (!) 109     Resp 05/02/20 2317 19     Temp 05/02/20 2317 97.8 F (36.6 C)     Temp Source 05/02/20 2317 Oral     SpO2 05/02/20 2317 97 %     Weight 05/02/20 2318 165 lb (74.8 kg)     Height 05/02/20 2318 5\' 8"  (1.727 m)     Head Circumference --      Peak Flow --      Pain Score 05/02/20 2318 0     Pain Loc --      Pain Edu? --      Excl. in GC? --     Constitutional: Alert and oriented.  Disheveled appearing and in no acute distress. Eyes: Conjunctivae are normal. PERRL. EOMI. Head: Atraumatic. Nose: No congestion/rhinnorhea. Mouth/Throat: Mucous membranes are moist.  Oropharynx non-erythematous. Neck: No stridor.   Cardiovascular: Normal rate, regular rhythm. Grossly normal heart sounds.  Good peripheral circulation. Respiratory: Normal respiratory effort.  No retractions. Lungs CTAB. Gastrointestinal: Soft and nontender. No distention. No abdominal bruits. No CVA tenderness. Musculoskeletal:  No lower extremity tenderness nor edema.  No joint effusions. Neurologic:  Normal speech and language. No gross focal neurologic deficits are appreciated. No gait instability. Skin:  Skin is warm, dry and intact. No rash noted. Psychiatric: Mood and affect are agitated. Speech and behavior are normal.  ____________________________________________   LABS (all labs ordered are listed, but only abnormal results are displayed)  Labs Reviewed  COMPREHENSIVE METABOLIC PANEL - Abnormal; Notable for the following components:      Result Value   Chloride 97 (*)    Glucose,  Bld 112 (*)    Total Protein 8.2 (*)    AST 122 (*)    ALT 105 (*)    Total Bilirubin 1.8 (*)    All other components within normal limits  ETHANOL - Abnormal; Notable for the following components:   Alcohol, Ethyl (B) 58 (*)    All other components within normal limits  CBC - Abnormal; Notable for the following components:   MCH 34.5 (*)    All other components within normal limits  URINE DRUG SCREEN, QUALITATIVE ([facility] ONLY) - Abnormal; Notable for the following components:   Amphetamines, Ur Screen POSITIVE (*)    Cocaine Metabolite,Ur [place] POSITIVE (*)    All other components within normal limits  SARS CORONAVIRUS 2 BY RT PCR (HOSPITAL ORDER, PERFORMED IN [place] [facility])   ____________________________________________  EKG  None ____________________________________________  RADIOLOGY  ED MD interpretation: None  Official radiology report(s): No results found.  ____________________________________________   PROCEDURES  Procedure(s) performed (including Critical Care):  Procedures   ____________________________________________   INITIAL IMPRESSION / ASSESSMENT AND PLAN / ED COURSE  As part of my medical decision making, I reviewed the following data within the electronic MEDICAL RECORD NUMBER Nursing notes reviewed and incorporated, Labs reviewed, A consult was requested and obtained from this/these consultant(s) Psychiatry and Notes from prior ED visits     Phillip Mcdaniel was evaluated in Emergency Department on 05/03/2020 for the symptoms described in the history of present illness. He was evaluated in the context of the global COVID-19 pandemic, which necessitated consideration that the patient might be at risk for infection with the SARS-CoV-2 virus that causes COVID-19. Institutional protocols and algorithms that pertain to the evaluation of patients at risk for COVID-19 are in a state of rapid change based on information released by regulatory bodies  including the CDC and federal and state organizations. These policies and algorithms were followed during the patient's care in the ED.    49 year old male brought to the ED under IVC for substance induced psychosis.  Will maintain IVC pending psychiatric evaluation and disposition.   Clinical Course as of May 03 628  Tue May 03, 2020  0136 Patient has been evaluated by psychiatric NP who recommends referral to detox facility. The patient has been placed in psychiatric observation due to the need to provide a safe environment for the patient while obtaining psychiatric consultation and evaluation, as well as ongoing medical and medication management to treat the patient's condition. The patient has been placed under full IVC at this time.     [JS]    Clinical Course User Index [JS] Irean Hong, MD     ____________________________________________   FINAL CLINICAL IMPRESSION(S) / ED DIAGNOSES  Final diagnoses:  Psychoactive substance-induced psychosis [facility])     ED Discharge Orders    None       Note:  This document was prepared using Dragon voice recognition software and  may include unintentional dictation errors.   Irean Hong, MD 05/03/20 [phone]

## 2020-05-03 NOTE — ED Notes (Signed)
IVC/  PENDING  PLACEMENT 

## 2020-05-03 NOTE — ED Notes (Signed)
IVC PENDING  CONSULT ?

## 2020-05-03 NOTE — ED Notes (Signed)
Gave patient a snack and drink.  

## 2020-05-03 NOTE — ED Notes (Signed)
Family at bedside. 

## 2020-05-03 NOTE — ED Notes (Signed)
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
## 2020-05-04 MED ORDER — ACETAMINOPHEN 325 MG PO TABS: 650.0000 mg | ORAL_TABLET | Freq: Four times a day (QID) | ORAL | Status: DC | PRN

## 2020-05-04 NOTE — ED Provider Notes (Signed)
Patient has been cleared by psychiatry for discharge   Emily Filbert, MD 05/04/20 1733

## 2020-05-04 NOTE — ED Notes (Addendum)
Pt discharged home. Discharge teaching done and pt verbalized understanding. Pt signed discharge paper form. Given all his personal belongings. Escorted to the lobby, ambulatory and in NAD.

## 2020-05-04 NOTE — BH Assessment (Signed)
Per Dr. Lucianne Muss pt is psychiatrically cleared for discharge with the recommendation to follow up with SA resources provided.

## 2020-05-04 NOTE — ED Notes (Signed)
Pt denies SI/HI/AVH on assessment 

## 2020-05-04 NOTE — BH Assessment (Signed)
TTS completed reassessment. Pt presents calm, pleasant and oriented x 3. Pt reports to currently feel ok and is requesting to go home. Pt reports to have learned his lesson around controlling his impulsive behaviors. Pt denies any current SI/HI/VH/AH and contracted for safety. Pt reports to have a home to return to and was provided OPT resource per request.   Pt's final disposition is pending provider reassessment.

## 2020-05-04 NOTE — BH Assessment (Signed)
Referral check for the following facilities:   [facility]- Pt. Denied due to insurance being out of network.  [facility]- Pt is currently under review, per [place].   [facility]-No answer  [facility]-Pt denied as the facility does not do detox/rehab.

## 2020-05-04 NOTE — ED Provider Notes (Signed)
Emergency Medicine Observation Re-evaluation Note  Phillip Mcdaniel is a 49 y.o. male, seen on rounds today.  Pt initially presented to the ED for complaints of Psychiatric Evaluation Currently, the patient is  Doing well. Reports some chronic right hip pain secondary to prior hip replacement flared up by walking on hard cement floors.   Physical Exam  BP (!) 141/105 (BP Location: Right Arm)   Pulse 97   Temp 98.2 F (36.8 C) (Oral)   Resp 17   Ht 5\' 8"  (1.727 m)   Wt 74.8 kg   SpO2 100%   BMI 25.09 kg/m  Physical Exam Physical Exam General: No apparent distress HEENT: moist mucous membranes CV: RRR Pulm: Normal WOB GI: soft and non tender MSK: no edema or cyanosis Neuro: face symmetric, moving all extremities    ED Course / MDM  EKG:  Clinical Course as of May 04 844  Tue May 03, 2020  0136 Patient has been evaluated by psychiatric NP who recommends referral to detox facility. The patient has been placed in psychiatric observation due to the need to provide a safe environment for the patient while obtaining psychiatric consultation and evaluation, as well as ongoing medical and medication management to treat the patient's condition. The patient has been placed under full IVC at this time.     [JS]    Clinical Course User Index [JS] 0137, MD   I have reviewed the labs performed to date as well as medications administered while in observation.  Recent changes in the last 24 hours include ativan 2mg  given yesterday morning.   Plan  Already getting PRN ibuprofen for pain--added some tylenol for chronic hip pain.   Current plan is for psych re-eval  Patient is under full IVC at this time.   Irean Hong, MD 05/04/20 [phone]

## 2020-05-05 ENCOUNTER — Emergency Department
Admission: EM | Admit: 2020-05-05 | Discharge: 2020-05-07 | Disposition: A | Discharge status: Other Acute Inpatient Hospital | Payer: Self-pay | Attending: Emergency Medicine | Admitting: Emergency Medicine

## 2020-05-05 ENCOUNTER — Encounter: Payer: Self-pay | Admitting: *Deleted

## 2020-05-05 DIAGNOSIS — I159 Secondary hypertension, unspecified: Secondary | ICD-10-CM

## 2020-05-05 DIAGNOSIS — I10 Essential (primary) hypertension: Secondary | ICD-10-CM | POA: Insufficient documentation

## 2020-05-05 DIAGNOSIS — R443 Hallucinations, unspecified: Secondary | ICD-10-CM

## 2020-05-05 DIAGNOSIS — F19959 Other psychoactive substance use, unspecified with psychoactive substance-induced psychotic disorder, unspecified: Secondary | ICD-10-CM | POA: Diagnosis present

## 2020-05-05 DIAGNOSIS — F32A Depression, unspecified: Secondary | ICD-10-CM | POA: Diagnosis present

## 2020-05-05 DIAGNOSIS — F19951 Other psychoactive substance use, unspecified with psychoactive substance-induced psychotic disorder with hallucinations: Secondary | ICD-10-CM | POA: Insufficient documentation

## 2020-05-05 DIAGNOSIS — Z20822 Contact with and (suspected) exposure to covid-19: Secondary | ICD-10-CM | POA: Insufficient documentation

## 2020-05-05 DIAGNOSIS — F1910 Other psychoactive substance abuse, uncomplicated: Secondary | ICD-10-CM | POA: Diagnosis present

## 2020-05-05 LAB — COMPREHENSIVE METABOLIC PANEL
ALT: 126 U/L — ABNORMAL HIGH (ref 0–44)
AST: 117 U/L — ABNORMAL HIGH (ref 15–41)
Albumin: 4.2 g/dL (ref 3.5–5.0)
Alkaline Phosphatase: 81 U/L (ref 38–126)
Anion gap: 10 (ref 5–15)
BUN: 9 mg/dL (ref 6–20)
CO2: 27 mmol/L (ref 22–32)
Calcium: 9.1 mg/dL (ref 8.9–10.3)
Chloride: 104 mmol/L (ref 98–111)
Creatinine, Ser: 0.8 mg/dL (ref 0.61–1.24)
GFR calc Af Amer: >60 mL/min (ref >60)
GFR calc non Af Amer: >60 mL/min (ref >60)
Glucose, Bld: 113 mg/dL — ABNORMAL HIGH (ref 70–99)
Potassium: 3.8 mmol/L (ref 3.5–5.1)
Sodium: 141 mmol/L (ref 135–145)
Total Bilirubin: 0.6 mg/dL (ref 0.3–1.2)
Total Protein: 7.8 g/dL (ref 6.5–8.1)

## 2020-05-05 LAB — CBC
HCT: 43.9 % (ref 39.0–52.0)
Hemoglobin: 16.1 g/dL (ref 13.0–17.0)
MCH: 35.1 pg — ABNORMAL HIGH (ref 26.0–34.0)
MCHC: 36.7 g/dL — ABNORMAL HIGH (ref 30.0–36.0)
MCV: 95.6 fL (ref 80.0–100.0)
Platelets: 199 10*3/uL (ref 150–400)
RBC: 4.59 MIL/uL (ref 4.22–5.81)
RDW: 12.8 % (ref 11.5–15.5)
WBC: 8.1 10*3/uL (ref 4.0–10.5)
nRBC: 0 % (ref 0.0–0.2)

## 2020-05-05 LAB — URINE DRUG SCREEN, QUALITATIVE (ARMC ONLY)
Amphetamines, Ur Screen: POSITIVE — AB
Barbiturates, Ur Screen: NOT DETECTED
Benzodiazepine, Ur Scrn: NOT DETECTED
Cannabinoid 50 Ng, Ur ~~LOC~~: NOT DETECTED
Cocaine Metabolite,Ur ~~LOC~~: NOT DETECTED
MDMA (Ecstasy)Ur Screen: NOT DETECTED
Methadone Scn, Ur: NOT DETECTED
Opiate, Ur Screen: NOT DETECTED
Phencyclidine (PCP) Ur S: NOT DETECTED
Tricyclic, Ur Screen: NOT DETECTED

## 2020-05-05 LAB — ETHANOL: Alcohol, Ethyl (B): 74 mg/dL — ABNORMAL HIGH (ref <10)

## 2020-05-05 MED ORDER — HYDROCHLOROTHIAZIDE 25 MG PO TABS
25.0000 mg | ORAL_TABLET | Freq: Every day | ORAL | Status: DC
  Administered 2020-05-05 – 2020-05-07 (×3): 25 mg via ORAL
  Filled 2020-05-05 (×3): qty 1

## 2020-05-05 MED ORDER — LORAZEPAM 1 MG PO TABS
1.0000 mg | ORAL_TABLET | Freq: Once | ORAL | Status: AC
  Administered 2020-05-05: 1 mg via ORAL
  Filled 2020-05-05: qty 1
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
## 2020-05-05 NOTE — BH Assessment (Signed)
Assessment Note  Phillip Mcdaniel is an 49 y.o. male presenting to [facility] ED under IVC. Per triage note Pt brought in by [employer].  Pt is IVC.  Pt reports he got into an argument with his wife.  Pt denies SI or HI.  Pt denies drug use.  Pt reports etoh use today.  Pt calm and cooperative.  Patient reports hallucinating that his wife was having sex with another person next to him in bed. During assessment patient appears alert and oriented x4, calm and cooperative, depressed, sad and paranoid. Patient reported why he was presenting to ED "I'm seeing people that aren't there, when my wife falls asleep beside me I see people beside my wife having sex with her then I see them disappear." Patient recently presented to ED due to substance abuse and has a history of substance use. Patient denies using any substances today but reports alcohol use. Patient UDS is positive for Amphetamines and BAL is 74. Patient reported drinking "2 twisted teas and a 12 oz beer." Patient also reports stressful factors including relationship issues with his wife due to his hallucinations and paranoia, recent job loss due his substance use and now current financial strains. Patient denies current SI/HI but reports VH and AH "I hear them whispering sometimes." Patient does not appear to be responding to any internal or external stimuli.  Per Psyc NP patient is recommended for Inpatient Hospitalization  Diagnosis: Polysubstance abuse, Substance-Induced Psychosis  Past Medical History:  Past Medical History:  Diagnosis Date  . AVN (avascular necrosis of bone) (HCC)    RIGHT hip  . Hypertension     Past Surgical History:  Procedure Laterality Date  . HIP SURGERY     RIGHT hip replacement    Family History: No family history on file.  Social History:  reports that he has never smoked. He has never used smokeless tobacco. He reports current alcohol use. He reports previous drug use. Drugs: Cocaine and  Marijuana.  Additional Social History:  Alcohol / Drug Use Pain Medications: See MAR Prescriptions: See MAR Over the Counter: See MAR History of alcohol / drug use?: Yes Substance #1 Name of Substance 1: Cocaine Substance #2 Name of Substance 2: Amphetamine Substance #3 Name of Substance 3: Alcohol  CIWA: CIWA-Ar BP: (!) 204/135 Pulse Rate: (!) 102 COWS:    Allergies: No Known Allergies  Home Medications: (Not in a hospital admission)   OB/GYN Status:  No LMP for male patient.  General Assessment Data Location of Assessment: [facility] ED TTS Assessment: In system Is this a Tele or Face-to-Face Assessment?: Face-to-Face Is this an Initial Assessment or a Re-assessment for this encounter?: Initial Assessment Patient Accompanied by:: N/A Language Other than English: No Living Arrangements: Other (Comment) (Private Residence) What gender do you identify as?: Male Marital status: Married Pregnancy Status: No Living Arrangements: Other (Comment) (Private resdience) Can pt return to current living arrangement?: Yes Admission Status: Involuntary Petitioner: Police Is patient capable of signing voluntary admission?: No Referral Source: Other Insurance type: None  Medical Screening Exam [facility] Walk-in ONLY) Medical Exam completed: Yes  Crisis Care Plan Living Arrangements: Other (Comment) (Private resdience) Legal Guardian: Other: (Self) Name of Psychiatrist: None Name of Therapist: None  Education Status Is patient currently in school?: No Is the patient employed, unemployed or receiving disability?: Unemployed  Risk to self with the past 6 months Suicidal Ideation: No Has patient been a risk to self within the past 6 months prior to admission? :  No Suicidal Intent: No Has patient had any suicidal intent within the past 6 months prior to admission? : No Is patient at risk for suicide?: No Suicidal Plan?: No Has patient had any suicidal plan within the past 6 months  prior to admission? : No Access to Means: No What has been your use of drugs/alcohol within the last 12 months?: Cocaine, Amphetamine, Alcohol Previous Attempts/Gestures: No How many times?: 0 Other Self Harm Risks: None Triggers for Past Attempts: None known Intentional Self Injurious Behavior: None Family Suicide History: No Recent stressful life event(s): Other (Comment), Conflict (Comment), Job Loss, Financial Problems (Conflict with wife, recent job loss) Persecutory voices/beliefs?: No Depression: Yes Depression Symptoms: Tearfulness, Isolating, Loss of interest in usual pleasures, Feeling worthless/self pity Substance abuse history and/or treatment for substance abuse?: Yes Suicide prevention information given to non-admitted patients: Not applicable  Risk to Others within the past 6 months Homicidal Ideation: No Does patient have any lifetime risk of violence toward others beyond the six months prior to admission? : No Thoughts of Harm to Others: No Current Homicidal Intent: No Current Homicidal Plan: No Access to Homicidal Means: No Identified Victim: None History of harm to others?: No Assessment of Violence: None Noted Violent Behavior Description: None Does patient have access to weapons?: No Criminal Charges Pending?: No Does patient have a court date: No Is patient on probation?: No  Psychosis Hallucinations: Visual, Auditory Delusions: Unspecified  Mental Status Report Appearance/Hygiene: In scrubs Eye Contact: Fair Motor Activity: Freedom of movement Speech: Logical/coherent Level of Consciousness: Alert Mood: Depressed, Anxious, Irritable, Sad Affect: Depressed, Sad Anxiety Level: Moderate Thought Processes: Coherent Judgement: Unimpaired Orientation: Person, Place, Time, Situation, Appropriate for developmental age Obsessive Compulsive Thoughts/Behaviors: None  Cognitive Functioning Concentration: Decreased Memory: Recent Intact, Remote Intact Is  patient IDD: No Insight: Fair Impulse Control: Poor Appetite: Poor Have you had any weight changes? : No Change Sleep: Decreased Total Hours of Sleep: 0 Vegetative Symptoms: None  ADLScreening [facility] Assessment Services) Patient's cognitive ability adequate to safely complete daily activities?: Yes Patient able to express need for assistance with ADLs?: Yes Independently performs ADLs?: Yes (appropriate for developmental age)  Prior Inpatient Therapy Prior Inpatient Therapy: No  Prior Outpatient Therapy Prior Outpatient Therapy: No Does patient have an ACCT team?: No Does patient have Intensive In-House Services?  : No Does patient have [facility] services? : No Does patient have P4CC services?: No  ADL Screening (condition at time of admission) Patient's cognitive ability adequate to safely complete daily activities?: Yes Is the patient deaf or have difficulty hearing?: No Does the patient have difficulty seeing, even when wearing glasses/contacts?: No Does the patient have difficulty concentrating, remembering, or making decisions?: No Patient able to express need for assistance with ADLs?: Yes Does the patient have difficulty dressing or bathing?: No Independently performs ADLs?: Yes (appropriate for developmental age) Does the patient have difficulty walking or climbing stairs?: No Weakness of Legs: None Weakness of Arms/Hands: None  Home Assistive Devices/Equipment Home Assistive Devices/Equipment: None  Therapy Consults (therapy consults require a physician order) PT Evaluation Needed: No OT Evalulation Needed: No SLP Evaluation Needed: No Abuse/Neglect Assessment (Assessment to be complete while patient is alone) Abuse/Neglect Assessment Can Be Completed: Yes Physical Abuse: Denies Verbal Abuse: Denies Sexual Abuse: Denies Exploitation of patient/patient's resources: Denies Self-Neglect: Denies Values / Beliefs Cultural Requests During Hospitalization:  None Spiritual Requests During Hospitalization: None Consults Spiritual Care Consult Needed: No Transition of Care Team Consult Needed: No Advance Directives (For Healthcare)  Does Patient Have a Medical Advance Directive?: No          Disposition: Per Psyc NP patient is recommended for Inpatient Hospitalization Disposition Initial Assessment Completed for this Encounter: Yes  On Site Evaluation by:   Reviewed with Physician:    Benay Pike MS LCASA 05/05/2020 10:13 PM

## 2020-05-05 NOTE — ED Provider Notes (Signed)
[facility] Emergency Department Provider Note  ____________________________________________   First MD Initiated Contact with Patient 05/05/20 2125     (approximate)  I have reviewed the triage vital signs and the nursing notes.   HISTORY  Chief Complaint Behavior Problem    HPI Phillip Mcdaniel is a 49 y.o. male with hypertension, substance abuse who comes in under IVC.  Patient got an argument with his wife.  He denies any SI or HI.  Denies taking any drugs but did use alcohol.  Patient states that he has not use any drugs but he is continued to have hallucinations.  Patient was IVC by his wife.  Hallucinations are severe, intermittent, nothing makes it better, nothing makes them worse.  States that he sees his wife cheating on him even though there is no one there.  He denies any other medical concerns            Past Medical History:  Diagnosis Date  . AVN (avascular necrosis of bone) (HCC)    RIGHT hip  . Hypertension     Patient Active Problem List   Diagnosis Date Noted  . Polysubstance abuse (HCC) 05/03/2020  . Depression 05/03/2020    Past Surgical History:  Procedure Laterality Date  . HIP SURGERY     RIGHT hip replacement    Prior to Admission medications   Medication Sig Start Date End Date Taking? Authorizing Provider  ipratropium (ATROVENT) 0.06 % nasal spray Place 2 sprays into both nostrils 4 (four) times daily as needed for rhinitis. 01/22/19 03/18/20  Tommie Sams, DO    Allergies Patient has no known allergies.  No family history on file.  Social History Social History   Tobacco Use  . Smoking status: Never Smoker  . Smokeless tobacco: Never Used  Vaping Use  . Vaping Use: Never used  Substance Use Topics  . Alcohol use: Yes    Comment: daily, at least 12 beers a day.  . Drug use: Not Currently    Types: Cocaine, Marijuana    Comment: 2 days ago      Review of Systems Constitutional: No  fever/chills Eyes: No visual changes. ENT: No sore throat. Cardiovascular: Denies chest pain. Respiratory: Denies shortness of breath. Gastrointestinal: No abdominal pain.  No nausea, no vomiting.  No diarrhea.  No constipation. Genitourinary: Negative for dysuria. Musculoskeletal: Negative for back pain. Skin: Negative for rash. Neurological: Negative for headaches, focal weakness or numbness. Psych: Hallucinations, denies SI HI All other ROS negative ____________________________________________   PHYSICAL EXAM:  VITAL SIGNS: ED Triage Vitals  Enc Vitals Group     BP 05/05/20 2042 (!) 204/135     Pulse Rate 05/05/20 2042 (!) 102     Resp 05/05/20 2042 20     Temp 05/05/20 2042 97.6 F (36.4 C)     Temp Source 05/05/20 2042 Oral     SpO2 05/05/20 2042 99 %     Weight 05/05/20 2044 170 lb (77.1 kg)     Height 05/05/20 2044 5\' 8"  (1.727 m)     Head Circumference --      Peak Flow --      Pain Score 05/05/20 2059 0     Pain Loc --      Pain Edu? --      Excl. in GC? --     Constitutional: Alert and oriented. Well appearing and in no acute distress. Eyes: Conjunctivae are normal. EOMI. Head: Atraumatic. Nose: No congestion/rhinnorhea.  Mouth/Throat: Mucous membranes are moist.   Neck: No stridor. Trachea Midline. FROM Cardiovascular: Normal rate, regular rhythm. Grossly normal heart sounds.  Good peripheral circulation. Respiratory: Normal respiratory effort.  No retractions. Lungs CTAB. Gastrointestinal: Soft and nontender. No distention. No abdominal bruits.  Musculoskeletal: No lower extremity tenderness nor edema.  No joint effusions. Neurologic:  Normal speech and language. No gross focal neurologic deficits are appreciated.  Skin:  Skin is warm, dry and intact. No rash noted. Psychiatric: Mood and affect are normal. Speech and behavior are normal.  Positive hallucinations GU: Deferred   ____________________________________________   LABS (all labs ordered are  listed, but only abnormal results are displayed)  Labs Reviewed  COMPREHENSIVE METABOLIC PANEL - Abnormal; Notable for the following components:      Result Value   Glucose, Bld 113 (*)    AST 117 (*)    ALT 126 (*)    All other components within normal limits  ETHANOL - Abnormal; Notable for the following components:   Alcohol, Ethyl (B) 74 (*)    All other components within normal limits  CBC - Abnormal; Notable for the following components:   MCH 35.1 (*)    MCHC 36.7 (*)    All other components within normal limits  URINE DRUG SCREEN, QUALITATIVE ([facility] ONLY) - Abnormal; Notable for the following components:   Amphetamines, Ur Screen POSITIVE (*)    All other components within normal limits  SARS CORONAVIRUS 2 BY RT PCR (HOSPITAL ORDER, PERFORMED IN [facility])   ____________________________________________    INITIAL IMPRESSION / ASSESSMENT AND PLAN / ED COURSE  Phillip Mcdaniel was evaluated in Emergency Department on 05/05/2020 for the symptoms described in the history of present illness. He was evaluated in the context of the global COVID-19 pandemic, which necessitated consideration that the patient might be at risk for infection with the SARS-CoV-2 virus that causes COVID-19. Institutional protocols and algorithms that pertain to the evaluation of patients at risk for COVID-19 are in a state of rapid change based on information released by regulatory bodies including the CDC and federal and state organizations. These policies and algorithms were followed during the patient's care in the ED.     Pt is without any acute medical complaints. No exam findings to suggest medical cause of current presentation. Will order psychiatric screening labs and discuss further w/ psychiatric service.  Patient was IVC by his wife.  Patient was hypertensive and tachycardic but seems little agitated.   Patient was given Ativan by the psychiatric team.  Will recheck vitals after  medications.On repeat his blood pressure was still elevated.  We will give him some hydrochlorothiazide to start on.  He reports being on blood pressure medicine before but not having any recent prescriptions.   D/d includes but is not limited to psychiatric disease, behavioral/personality disorder, inadequate socioeconomic support, medical.  Based on HPI, exam, unremarkable labs, no concern for acute medical problem at this time. No rigidity, clonus, hyperthermia, focal neurologic deficit, diaphoresis, tachycardia, meningismus, ataxia, gait abnormality or other finding to suggest this visit represents a non-psychiatric problem. Screening labs reviewed.    Given this, pt medically cleared, to be dispositioned per Psych.   ____________________________________________   FINAL CLINICAL IMPRESSION(S) / ED DIAGNOSES   Final diagnoses:  Hallucinations  Secondary hypertension      MEDICATIONS GIVEN DURING THIS VISIT:  Medications  hydrochlorothiazide (HYDRODIURIL) tablet 25 mg (has no administration in time range)  LORazepam (ATIVAN) tablet 1 mg (1  mg Oral Given 05/05/20 2240)     ED Discharge Orders    None       Note:  This document was prepared using Dragon voice recognition software and may include unintentional dictation errors.   Concha Se, MD 05/05/20 [id number]

## 2020-05-05 NOTE — ED Notes (Signed)
ED Provider at bedside. 

## 2020-05-05 NOTE — ED Notes (Signed)
Pt dressed in burgundy scrubs by this tech and two deputies. Pt items placed in belongings bag are the following: white and blue sneakers white underwear white and grey socks camouflage shorts White t-shirt White and red hat Cell phone  glasses

## 2020-05-05 NOTE — ED Notes (Signed)
Meal tray provided.

## 2020-05-05 NOTE — ED Notes (Signed)
Psychiatry and TTS at bedside 

## 2020-05-05 NOTE — ED Triage Notes (Signed)
Pt brought in by [employer].  Pt is IVC.  Pt reports he got into an argument with his wife.  Pt denies SI or HI.  Pt denies drug use.  Pt reports etoh use today.  Pt calm and cooperative

## 2020-05-06 DIAGNOSIS — F19959 Other psychoactive substance use, unspecified with psychoactive substance-induced psychotic disorder, unspecified: Secondary | ICD-10-CM | POA: Diagnosis present

## 2020-05-06 LAB — SARS CORONAVIRUS 2 BY RT PCR (HOSPITAL ORDER, PERFORMED IN ~~LOC~~ HOSPITAL LAB): SARS Coronavirus 2: NEGATIVE

## 2020-05-06 MED ORDER — TRAZODONE HCL 50 MG PO TABS
50.0000 mg | ORAL_TABLET | Freq: Every day | ORAL | Status: DC
  Administered 2020-05-06 (×2): 50 mg via ORAL
  Filled 2020-05-06: qty 1

## 2020-05-06 MED ORDER — LORAZEPAM 1 MG PO TABS: 1.0000 mg | ORAL_TABLET | Freq: Two times a day (BID) | ORAL | Status: DC

## 2020-05-06 MED ORDER — AMLODIPINE BESYLATE 5 MG PO TABS
5.0000 mg | ORAL_TABLET | Freq: Every day | ORAL | Status: DC
  Administered 2020-05-06 – 2020-05-07 (×2): 5 mg via ORAL
  Filled 2020-05-06 (×2): qty 1

## 2020-05-06 MED ORDER — LORAZEPAM 1 MG PO TABS
1.0000 mg | ORAL_TABLET | Freq: Four times a day (QID) | ORAL | Status: AC
  Administered 2020-05-06 (×4): 1 mg via ORAL
  Filled 2020-05-06 (×4): qty 1

## 2020-05-06 MED ORDER — LORAZEPAM 1 MG PO TABS
1.0000 mg | ORAL_TABLET | Freq: Three times a day (TID) | ORAL | Status: DC
  Administered 2020-05-07: 1 mg via ORAL
  Filled 2020-05-06: qty 1

## 2020-05-06 MED ORDER — THIAMINE HCL 100 MG PO TABS: 100.0000 mg | ORAL_TABLET | Freq: Every day | ORAL | Status: DC

## 2020-05-06 MED ORDER — THIAMINE HCL 100 MG/ML IJ SOLN
100.0000 mg | Freq: Once | INTRAMUSCULAR | Status: DC
  Filled 2020-05-06: qty 2

## 2020-05-06 MED ORDER — LORAZEPAM 1 MG PO TABS
1.0000 mg | ORAL_TABLET | Freq: Four times a day (QID) | ORAL | Status: DC | PRN
  Administered 2020-05-06: 1 mg via ORAL
  Filled 2020-05-06: qty 1

## 2020-05-06 MED ORDER — LOPERAMIDE HCL 2 MG PO CAPS
2.0000 mg | ORAL_CAPSULE | ORAL | Status: DC | PRN
  Filled 2020-05-06: qty 2

## 2020-05-06 MED ORDER — LORAZEPAM 1 MG PO TABS
1.0000 mg | ORAL_TABLET | Freq: Every day | ORAL | Status: AC
  Administered 2020-05-06: 1 mg via ORAL

## 2020-05-06 MED ORDER — ADULT MULTIVITAMIN W/MINERALS CH
1.0000 | ORAL_TABLET | Freq: Every day | ORAL | Status: DC
  Administered 2020-05-06: 1 via ORAL
  Filled 2020-05-06: qty 1

## 2020-05-06 MED ORDER — AMLODIPINE BESYLATE 5 MG PO TABS: 10.0000 mg | ORAL_TABLET | Freq: Once | ORAL | Status: DC

## 2020-05-06 MED ORDER — ONDANSETRON 4 MG PO TBDP
4.0000 mg | ORAL_TABLET | Freq: Four times a day (QID) | ORAL | Status: DC | PRN
  Administered 2020-05-07: 4 mg via ORAL
  Filled 2020-05-06 (×3): qty 1

## 2020-05-06 MED ORDER — HYDROXYZINE HCL 25 MG PO TABS
25.0000 mg | ORAL_TABLET | Freq: Four times a day (QID) | ORAL | Status: DC | PRN
  Administered 2020-05-06: 25 mg via ORAL
  Filled 2020-05-06: qty 1

## 2020-05-06 NOTE — ED Notes (Signed)
IVC PENDING  CONSULT ?

## 2020-05-06 NOTE — BH Assessment (Signed)
Staffed patient with BMU to determine criteria for admission, current patient's blood pressure has been high upon arrival at 204/135 but has since decreased to 185/124 at 23:14. BMU Charge nurse Susy Frizzle would like for patient's blood pressure to be monitored until the morning and obtain another BP during day shift 05/06/20 before determining if patient can be admitted. This Clinical research associate will communicate this to patient's nurse and day shift TTS to follow-up.

## 2020-05-06 NOTE — ED Notes (Signed)
Hourly rounding reveals patient in room. No complaints, stable, in no acute distress. Q15 minute rounds and monitoring via Security Cameras to continue. 

## 2020-05-06 NOTE — ED Notes (Signed)
Pt BP is elevated,  ED attending notified waiting for orders.

## 2020-05-06 NOTE — BH Assessment (Addendum)
Referral information for Psychiatric Hospitalization faxed to;   [facility] [phone]),   . [facility] ([phone]),  . [facility] [phone], [phone], [phone] or [phone]),   . [facility] [phone]), Under review with [facility]  . [facility] [phone] -or- [phone]), Denied due to no insurance   [facility] [phone])

## 2020-05-06 NOTE — ED Notes (Signed)
Report to include Situation, Background, Assessment, and Recommendations received from RN. Patient alert and oriented, warm and dry, in no acute distress. Patient denies SI, HI, AVH and pain. Patient made aware of Q15 minute rounds and security cameras for their safety. Patient instructed to come to me with needs or concerns. Pt BP is high, will provide CIWA care as ordered.
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
## 2020-05-06 NOTE — ED Notes (Signed)
Patient up in day room, po meds given. Md made aware of hypertension. Breakfast ordered and given to patient. Patient provided, phone during phone hours.

## 2020-05-06 NOTE — ED Notes (Signed)
Assumed care of patient, patient up early to nursing station reqesting phone and medications. Patient advised medications are schedule and he will get them during that time. Patient with steady gait clear speech, no obvious tremors or sweating noted. skin appeared pink and dry. Safety maintained. Will monitor. Awaiting plan further of care today.

## 2020-05-07 MED ORDER — AMLODIPINE BESYLATE 5 MG PO TABS: 10.0000 mg | ORAL_TABLET | Freq: Once | ORAL | Status: DC

## 2020-05-07 NOTE — ED Notes (Signed)
emtala reviewed by this rn 

## 2020-05-07 NOTE — ED Notes (Signed)
IVC/Pending Placement 

## 2020-05-07 NOTE — ED Notes (Signed)
Pt discharged to [facility] under IVC.  Pt calm and cooperative.  VS stable  Belongings sent with officer. Report given to admissions.

## 2020-05-07 NOTE — ED Provider Notes (Signed)
Patient just started amlodipine yesterday.  We will give him a couple days to stabilize on this medicine before we change his medicines again.   Arnaldo Natal, MD 05/07/20 [phone]

## 2020-05-07 NOTE — ED Provider Notes (Deleted)
Patient's blood pressure remains somewhat elevated.  Will increase his amlodipine to 10 mg a day now.  See how he does with that.  He might have to increase or add another medication.   Arnaldo Natal, MD 05/07/20 [phone]

## 2020-05-07 NOTE — ED Notes (Signed)
Attempted to call report. No answer.

## 2020-05-07 NOTE — ED Notes (Signed)
Pt admitted to [facility] after 8AM 

## 2020-05-07 NOTE — ED Provider Notes (Signed)
Emergency Medicine Observation Re-evaluation Note  Phillip Mcdaniel is a 49 y.o. male, seen on rounds today.  Pt initially presented to the ED for complaints of Behavior Problem Currently, the patient is resting.  Physical Exam  BP (!) 174/129 (BP Location: Right Arm)   Pulse (!) 111   Temp 98.7 F (37.1 C) (Oral)   Resp 18   Ht 1.727 m (5\' 8" )   Wt 77.1 kg   SpO2 98%   BMI 25.85 kg/m    Physical Exam  General: Generally well-appearing and in no acute distress. Respiratory: Breathing easily and comfortably, no accessory muscle usage or intercostal retractions. Neuro: Patient moving all 4 extremities with no gross neurological deficits noted. Psych: Calm and cooperative.  ED Course / MDM  EKG:    I have reviewed the labs performed to date as well as medications administered while in observation.  Recent changes in the last 24 hours include psych assessment and placement. Plan  Current plan is for transfer to [facility] later today. Patient is under full IVC at this time.   [facility], MD 05/07/20 [phone]

## 2020-05-07 NOTE — BH Assessment (Signed)
PATIENT BED AVAILABLE AFTER 8AM  Patient has been accepted to [facility].  Patient assigned to [facility] Accepting physician is Dr. Cornwall.  Call report to [phone].  Representative was Pax.   ER Staff is aware of it:  Melody ER Secretary  Dr. Forbach, ER MD  Ann Patient's Nurse     Address: [address] [place], [place] [address]   

## 2020-07-21 ENCOUNTER — Ambulatory Visit: Payer: Self-pay | Admitting: Nurse Practitioner

## 2020-07-21 DIAGNOSIS — Z0289 Encounter for other administrative examinations: Secondary | ICD-10-CM

## 2020-08-08 ENCOUNTER — Encounter: Payer: Self-pay | Admitting: Emergency Medicine

## 2020-08-08 ENCOUNTER — Emergency Department
Admission: EM | Admit: 2020-08-08 | Discharge: 2020-08-08 | Disposition: A | Discharge status: Home / Self Care | Payer: Self-pay | Attending: Emergency Medicine | Admitting: Emergency Medicine

## 2020-08-08 ENCOUNTER — Other Ambulatory Visit: Payer: Self-pay

## 2020-08-08 DIAGNOSIS — Z96641 Presence of right artificial hip joint: Secondary | ICD-10-CM | POA: Insufficient documentation

## 2020-08-08 DIAGNOSIS — Z0289 Encounter for other administrative examinations: Secondary | ICD-10-CM | POA: Insufficient documentation

## 2020-08-08 DIAGNOSIS — R7401 Elevation of levels of liver transaminase levels: Secondary | ICD-10-CM | POA: Insufficient documentation

## 2020-08-08 DIAGNOSIS — E876 Hypokalemia: Secondary | ICD-10-CM | POA: Insufficient documentation

## 2020-08-08 DIAGNOSIS — F1510 Other stimulant abuse, uncomplicated: Secondary | ICD-10-CM | POA: Insufficient documentation

## 2020-08-08 DIAGNOSIS — F1290 Cannabis use, unspecified, uncomplicated: Secondary | ICD-10-CM | POA: Insufficient documentation

## 2020-08-08 DIAGNOSIS — I10 Essential (primary) hypertension: Secondary | ICD-10-CM | POA: Insufficient documentation

## 2020-08-08 LAB — COMPREHENSIVE METABOLIC PANEL
ALT: 101 U/L — ABNORMAL HIGH (ref 0–44)
AST: 96 U/L — ABNORMAL HIGH (ref 15–41)
Albumin: 4.4 g/dL (ref 3.5–5.0)
Alkaline Phosphatase: 69 U/L (ref 38–126)
Anion gap: 13 (ref 5–15)
BUN: 8 mg/dL (ref 6–20)
CO2: 22 mmol/L (ref 22–32)
Calcium: 9.6 mg/dL (ref 8.9–10.3)
Chloride: 103 mmol/L (ref 98–111)
Creatinine, Ser: 0.96 mg/dL (ref 0.61–1.24)
GFR, Estimated: >60 mL/min (ref >60)
Glucose, Bld: 110 mg/dL — ABNORMAL HIGH (ref 70–99)
Potassium: 3.1 mmol/L — ABNORMAL LOW (ref 3.5–5.1)
Sodium: 138 mmol/L (ref 135–145)
Total Bilirubin: 2 mg/dL — ABNORMAL HIGH (ref 0.3–1.2)
Total Protein: 7.9 g/dL (ref 6.5–8.1)

## 2020-08-08 LAB — CBC WITH DIFFERENTIAL/PLATELET
Abs Immature Granulocytes: 0.02 10*3/uL (ref 0.00–0.07)
Basophils Absolute: 0.1 10*3/uL (ref 0.0–0.1)
Basophils Relative: 1 %
Eosinophils Absolute: 0.1 10*3/uL (ref 0.0–0.5)
Eosinophils Relative: 1 %
HCT: 40.6 % (ref 39.0–52.0)
Hemoglobin: 14.9 g/dL (ref 13.0–17.0)
Immature Granulocytes: 0 %
Lymphocytes Relative: 25 %
Lymphs Abs: 1.9 10*3/uL (ref 0.7–4.0)
MCH: 33.8 pg (ref 26.0–34.0)
MCHC: 36.7 g/dL — ABNORMAL HIGH (ref 30.0–36.0)
MCV: 92.1 fL (ref 80.0–100.0)
Monocytes Absolute: 0.8 10*3/uL (ref 0.1–1.0)
Monocytes Relative: 10 %
Neutro Abs: 4.8 10*3/uL (ref 1.7–7.7)
Neutrophils Relative %: 63 %
Platelets: 195 10*3/uL (ref 150–400)
RBC: 4.41 MIL/uL (ref 4.22–5.81)
RDW: 12.5 % (ref 11.5–15.5)
WBC: 7.6 10*3/uL (ref 4.0–10.5)
nRBC: 0 % (ref 0.0–0.2)

## 2020-08-08 LAB — MAGNESIUM: Magnesium: 2 mg/dL (ref 1.7–2.4)

## 2020-08-08 LAB — CK: Total CK: 522 U/L — ABNORMAL HIGH (ref 49–397)

## 2020-08-08 LAB — ETHANOL: Alcohol, Ethyl (B): <10 mg/dL (ref <10)

## 2020-08-08 MED ORDER — POTASSIUM CHLORIDE CRYS ER 20 MEQ PO TBCR
40.0000 meq | EXTENDED_RELEASE_TABLET | Freq: Once | ORAL | Status: AC
  Administered 2020-08-08: 40 meq via ORAL
  Filled 2020-08-08: qty 2

## 2020-08-08 MED ORDER — LACTATED RINGERS IV BOLUS
1000.0000 mL | Freq: Once | INTRAVENOUS | Status: AC
  Administered 2020-08-08: 1000 mL via INTRAVENOUS

## 2020-08-08 MED ORDER — MIDAZOLAM HCL 2 MG/2ML IJ SOLN
2.0000 mg | Freq: Once | INTRAMUSCULAR | Status: AC
  Administered 2020-08-08: 2 mg via INTRAMUSCULAR
  Filled 2020-08-08: qty 2

## 2020-08-08 NOTE — ED Notes (Signed)
Pt discharged in police custody (officer at bedside for duration of stay).  VS stable.  No personal belongings brought to hospital.

## 2020-08-08 NOTE — ED Notes (Signed)
Red top sent  

## 2020-08-08 NOTE — ED Notes (Signed)
Pt unable to sign for discharge. Pt is under the influence of drugs. Speech is word salad.

## 2020-08-08 NOTE — Discharge Instructions (Signed)
Your liver enzymes were slightly elevated today.  This could be related to your recent methamphetamine use although I would recommend following up with a primary care physician when you are able to be tested for viral hepatitis as well.

## 2020-08-08 NOTE — ED Provider Notes (Addendum)
[facility] Emergency Department Provider Note  ____________________________________________   First MD Initiated Contact with Patient 08/08/20 [phone]     (approximate)  I have reviewed the triage vital signs and the nursing notes.   HISTORY  Chief Complaint Medical Clearance   HPI Phillip Mcdaniel is a 49 y.o. male with past medical history of HTN, right hip avascular necrosis and polysubstance abuse who presents in police custody after reportedly using meth last night.  Patient endorses using meth last night and states he has to urinate but is significantly agitated on my interview and refuses to participate in further interview questions.  No other history is immediately available on patient arrival.  Per police were accompanying patient patient was found to be using meth on the awaiting for medical clearance to take the patient to jail.  Patient denies taking any daily medications or drug allergies.         Past Medical History:  Diagnosis Date  . AVN (avascular necrosis of bone) (HCC)    RIGHT hip  . Hypertension     Patient Active Problem List   Diagnosis Date Noted  . 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 Polysubstance abuse (HCC) 05/03/2020  . Depression 05/03/2020    Past Surgical History:  Procedure Laterality Date  . HIP SURGERY     RIGHT hip replacement    Prior to Admission medications   Medication Sig Start Date End Date Taking? Authorizing Provider  ipratropium (ATROVENT) 0.06 % nasal spray Place 2 sprays into both nostrils 4 (four) times daily as needed for rhinitis. 01/22/19 03/18/20  Tommie Sams, DO    Allergies Patient has no known allergies.  No family history on file.  Social History Social History   Tobacco Use  . Smoking status: Never Smoker  . Smokeless tobacco: Never Used  Vaping Use  . Vaping Use: Never used  Substance Use Topics  . Alcohol use: Yes    Comment: daily, at least 12 beers  a day.  . Drug use: Yes    Types: Cocaine, Marijuana, Methamphetamines    Review of Systems  Review of Systems  Unable to perform ROS: Psychiatric disorder      ____________________________________________   PHYSICAL EXAM:  VITAL SIGNS: ED Triage Vitals  Enc Vitals Group     BP 08/08/20 0639 (!) 157/107     Pulse Rate 08/08/20 0639 (!) 107     Resp 08/08/20 0639 20     Temp 08/08/20 0639 98.2 F (36.8 C)     Temp Source 08/08/20 0639 Oral     SpO2 08/08/20 0639 98 %     Weight 08/08/20 0640 170 lb (77.1 kg)     Height 08/08/20 0640 5\' 7"  (1.702 m)     Head Circumference --      Peak Flow --      Pain Score 08/08/20 0639 0     Pain Loc --      Pain Edu? --      Excl. in [place]? --    Vitals:   08/08/20 0639  BP: (!) 157/107  Pulse: (!) 107  Resp: 20  Temp: 98.2 F (36.8 C)  SpO2: 98%   Physical Exam Vitals and nursing note reviewed.  Constitutional:      Appearance: He is well-developed.  HENT:     Head: Normocephalic and atraumatic.     Right Ear: External ear normal.     Left Ear: External ear normal.  Nose: Nose normal.  Eyes:     Conjunctiva/sclera: Conjunctivae normal.  Cardiovascular:     Rate and Rhythm: Normal rate and regular rhythm.     Heart sounds: No murmur heard.   Pulmonary:     Effort: Pulmonary effort is normal. No respiratory distress.     Breath sounds: Normal breath sounds.  Abdominal:     Palpations: Abdomen is soft.     Tenderness: There is no abdominal tenderness.  Musculoskeletal:     Cervical back: Neck supple.  Skin:    General: Skin is warm and dry.     Capillary Refill: Capillary refill takes less than 2 seconds.  Neurological:     Mental Status: He is alert.  Psychiatric:        Behavior: Behavior is agitated, aggressive and hyperactive.        Thought Content: Thought content does not include homicidal or suicidal ideation.     PERRLA. EOMI. Patient is standing and walking pacing yelling at this examiner during  exam. ____________________________________________   LABS (all labs ordered are listed, but only abnormal results are displayed)  Labs Reviewed  CBC WITH DIFFERENTIAL/PLATELET - Abnormal; Notable for the following components:      Result Value   MCHC 36.7 (*)    All other components within normal limits  COMPREHENSIVE METABOLIC PANEL - Abnormal; Notable for the following components:   Potassium 3.1 (*)    Glucose, Bld 110 (*)    AST 96 (*)    ALT 101 (*)    Total Bilirubin 2.0 (*)    All other components within normal limits  CK - Abnormal; Notable for the following components:   Total CK 522 (*)    All other components within normal limits  MAGNESIUM  ETHANOL   ____________________________________________  EKG  Sinus tachycardia with a ventricular rate of 106, very poor tracing in lead I, lead II, lead III, and some nonspecific ST changes in anterior leads with no other clear evidence of acute ischemia.  Reviewed the CT obtained after IV fluids, potassium, and Versed with sinus rhythm with a heart rate of 81, normal axis, unremarkable intervals, and no clear evidence of acute ischemia. ____________________________________________  ____________________________________________   PROCEDURES  Procedure(s) performed (including Critical Care):  Procedures   ____________________________________________   INITIAL IMPRESSION / ASSESSMENT AND PLAN / ED COURSE        Patient presents with [place] to history exam for assessment prior to going to jail with police after he reportedly ingested methamphetamine.  Patient is somewhat agitated on initial assessment and does not participate in full history or participate in formal neuro exam.  He is noted be hypertensive with a BP of 157/107 and slightly tachycardic with a heart rate of 107 otherwise stable vital signs on room air.  Impression is likely intoxication from methamphetamine use reported by patient.  Patient on above  metabolic work-up was noted to be slightly hypokalemic with a transaminitis that was present on prior labs and a T bili of 2 although patient has had T bili to 1.8 several months ago and this does not appear to be an acute change.  No evidence of any significant acidosis and kidney function is within normal limits.  Ethanol to tactile and magnesium is WNL.  CBC is unremarkable.  CK is slightly above normal limits at 522 and I have a low suspicion for clinically significant rhabdomyolysis at this time.  While patient refuses to engage in detailed questions regarding how  he is feeling at presentation he denies chest pain and nonspecific changes on EKG likely secondary to increased sympathetic state secondary to meth use have a low suspicion for coronary artery thrombosis or spasm at this time.  Repeat EKG after below noted medications with heart rate of 81 no clear evidence of ischemia.  Given patient denies any SI or HI and overall presentation is consistent with meth intoxication on arrival low suspicion for other toxic ingestion at this time.  Patient was observed for approximately 3 hours in the emergency room and on assessment after receiving Versed and IV fluids patient was much calmer state he feels little better.  Given improvement in heart rate and patient denying any other acute symptoms or [place] safe for discharge to police custody.  Discharged stable condition. ____________________________________________   FINAL CLINICAL IMPRESSION(S) / ED DIAGNOSES  Final diagnoses:  Methamphetamine abuse (HCC)  Hypokalemia  Transaminitis    Medications  midazolam (VERSED) injection 2 mg (2 mg Intramuscular Given 08/08/20 0811)  lactated ringers bolus 1,000 mL (1,000 mLs Intravenous New Bag/Given 08/08/20 0812)  potassium chloride SA (KLOR-CON) CR tablet 40 mEq (40 mEq Oral Given 08/08/20 0834)     ED Discharge Orders    None       Note:  This document was prepared using Dragon voice  recognition software and may include unintentional dictation errors.   Gilles Chiquito, MD 08/08/20 5053    Gilles Chiquito, MD 08/08/20 [phone]

## 2020-08-08 NOTE — ED Triage Notes (Signed)
Patient brought in by sheriff for medical clearance for jail. Per officer patient has been using methamphetamines. Patient alert and uncooperative at this time. Patient states that he is not sure when he used the methamphetamines.

## 2020-08-09 ENCOUNTER — Ambulatory Visit: Payer: Self-pay | Admitting: Family Medicine

## 2021-08-08 DIAGNOSIS — H547 Unspecified visual loss: Secondary | ICD-10-CM | POA: Insufficient documentation

## 2021-08-08 DIAGNOSIS — I10 Essential (primary) hypertension: Secondary | ICD-10-CM | POA: Insufficient documentation

## 2021-08-08 DIAGNOSIS — H9313 Tinnitus, bilateral: Secondary | ICD-10-CM | POA: Insufficient documentation

## 2021-08-08 DIAGNOSIS — G8929 Other chronic pain: Secondary | ICD-10-CM | POA: Insufficient documentation

## 2021-08-08 DIAGNOSIS — N528 Other male erectile dysfunction: Secondary | ICD-10-CM | POA: Insufficient documentation

## 2021-08-08 DIAGNOSIS — F419 Anxiety disorder, unspecified: Secondary | ICD-10-CM | POA: Insufficient documentation

## 2021-08-08 DIAGNOSIS — F1010 Alcohol abuse, uncomplicated: Secondary | ICD-10-CM | POA: Insufficient documentation

## 2021-08-10 DIAGNOSIS — R748 Abnormal levels of other serum enzymes: Secondary | ICD-10-CM | POA: Insufficient documentation

## 2021-08-12 DIAGNOSIS — B182 Chronic viral hepatitis C: Secondary | ICD-10-CM | POA: Insufficient documentation

## 2021-11-18 ENCOUNTER — Emergency Department: Payer: Self-pay

## 2021-11-18 ENCOUNTER — Other Ambulatory Visit: Payer: Self-pay

## 2021-11-18 DIAGNOSIS — R0789 Other chest pain: Secondary | ICD-10-CM | POA: Insufficient documentation

## 2021-11-18 DIAGNOSIS — Z96641 Presence of right artificial hip joint: Secondary | ICD-10-CM | POA: Insufficient documentation

## 2021-11-18 DIAGNOSIS — R42 Dizziness and giddiness: Secondary | ICD-10-CM | POA: Insufficient documentation

## 2021-11-18 DIAGNOSIS — Z79899 Other long term (current) drug therapy: Secondary | ICD-10-CM | POA: Insufficient documentation

## 2021-11-18 DIAGNOSIS — F1290 Cannabis use, unspecified, uncomplicated: Secondary | ICD-10-CM | POA: Insufficient documentation

## 2021-11-18 DIAGNOSIS — I10 Essential (primary) hypertension: Secondary | ICD-10-CM | POA: Insufficient documentation

## 2021-11-18 LAB — BASIC METABOLIC PANEL
Anion gap: 10 (ref 5–15)
BUN: 12 mg/dL (ref 6–20)
CO2: 27 mmol/L (ref 22–32)
Calcium: 9.6 mg/dL (ref 8.9–10.3)
Chloride: 103 mmol/L (ref 98–111)
Creatinine, Ser: 0.78 mg/dL (ref 0.61–1.24)
GFR, Estimated: >60 mL/min (ref >60)
Glucose, Bld: 149 mg/dL — ABNORMAL HIGH (ref 70–99)
Potassium: 3.5 mmol/L (ref 3.5–5.1)
Sodium: 140 mmol/L (ref 135–145)

## 2021-11-18 LAB — CBC
HCT: 42.2 % (ref 39.0–52.0)
Hemoglobin: 14.6 g/dL (ref 13.0–17.0)
MCH: 33.6 pg (ref 26.0–34.0)
MCHC: 34.6 g/dL (ref 30.0–36.0)
MCV: 97.2 fL (ref 80.0–100.0)
Platelets: 189 10*3/uL (ref 150–400)
RBC: 4.34 MIL/uL (ref 4.22–5.81)
RDW: 13.6 % (ref 11.5–15.5)
WBC: 5.2 10*3/uL (ref 4.0–10.5)
nRBC: 0 % (ref 0.0–0.2)

## 2021-11-18 LAB — TROPONIN I (HIGH SENSITIVITY): Troponin I (High Sensitivity): 3 ng/L (ref <18)

## 2021-11-18 NOTE — ED Triage Notes (Signed)
First RN Note: pt to ED via [facility] with c/o sudden onset CP. Per EMS upon arrival HR noted to be 150, noted to be agitated on arrival. Per EMS initial BP 200/100. Per EMS pt endorses marijuanna use PTA.     136/93 100 ST CBG 112  20g R Forearm 4ASA given en route

## 2021-11-18 NOTE — ED Triage Notes (Signed)
Pt presents to ER via ems from home.  Pt states he had a sudden onset of chest pain, dizziness and some disorientation that began around 2000 tonight.  Pt currently endorses some blurry vision.  Pt A&O x3 at this time.  Pt appears somewhat out of it.

## 2021-11-19 ENCOUNTER — Emergency Department: Payer: Self-pay

## 2021-11-19 ENCOUNTER — Emergency Department
Admission: EM | Admit: 2021-11-19 | Discharge: 2021-11-19 | Disposition: A | Discharge status: Home / Self Care | Payer: Self-pay | Attending: Emergency Medicine | Admitting: Emergency Medicine

## 2021-11-19 DIAGNOSIS — R42 Dizziness and giddiness: Secondary | ICD-10-CM

## 2021-11-19 DIAGNOSIS — R079 Chest pain, unspecified: Secondary | ICD-10-CM

## 2021-11-19 DIAGNOSIS — F1290 Cannabis use, unspecified, uncomplicated: Secondary | ICD-10-CM

## 2021-11-19 LAB — LIPASE, BLOOD: Lipase: 41 U/L (ref 11–51)

## 2021-11-19 LAB — HEPATIC FUNCTION PANEL
ALT: 103 U/L — ABNORMAL HIGH (ref 0–44)
AST: 65 U/L — ABNORMAL HIGH (ref 15–41)
Albumin: 4 g/dL (ref 3.5–5.0)
Alkaline Phosphatase: 89 U/L (ref 38–126)
Bilirubin, Direct: 0.1 mg/dL (ref 0.0–0.2)
Indirect Bilirubin: 0.4 mg/dL (ref 0.3–0.9)
Total Bilirubin: 0.5 mg/dL (ref 0.3–1.2)
Total Protein: 7.8 g/dL (ref 6.5–8.1)

## 2021-11-19 LAB — URINE DRUG SCREEN, QUALITATIVE (ARMC ONLY)
Amphetamines, Ur Screen: NOT DETECTED
Barbiturates, Ur Screen: NOT DETECTED
Benzodiazepine, Ur Scrn: NOT DETECTED
Cannabinoid 50 Ng, Ur ~~LOC~~: POSITIVE — AB
Cocaine Metabolite,Ur ~~LOC~~: NOT DETECTED
MDMA (Ecstasy)Ur Screen: NOT DETECTED
Methadone Scn, Ur: NOT DETECTED
Opiate, Ur Screen: NOT DETECTED
Phencyclidine (PCP) Ur S: NOT DETECTED
Tricyclic, Ur Screen: NOT DETECTED

## 2021-11-19 LAB — ETHANOL: Alcohol, Ethyl (B): <10 mg/dL (ref <10)

## 2021-11-19 LAB — AMMONIA: Ammonia: 26 umol/L (ref 9–35)

## 2021-11-19 LAB — TROPONIN I (HIGH SENSITIVITY): Troponin I (High Sensitivity): <2 ng/L (ref <18)

## 2021-11-19 MED ORDER — MECLIZINE HCL 25 MG PO TABS: 25.0000 mg | ORAL_TABLET | Freq: Three times a day (TID) | ORAL | 0 refills | Status: DC | PRN

## 2021-11-19 MED ORDER — DIAZEPAM 5 MG/ML IJ SOLN
2.0000 mg | Freq: Once | INTRAMUSCULAR | Status: AC
  Administered 2021-11-19: 2 mg via INTRAVENOUS
  Filled 2021-11-19: qty 2

## 2021-11-19 MED ORDER — ONDANSETRON 4 MG PO TBDP: 4.0000 mg | ORAL_TABLET | Freq: Three times a day (TID) | ORAL | 0 refills | Status: DC | PRN

## 2021-11-19 MED ORDER — ONDANSETRON HCL 4 MG/2ML IJ SOLN
4.0000 mg | Freq: Once | INTRAMUSCULAR | Status: AC
  Administered 2021-11-19: 4 mg via INTRAVENOUS
  Filled 2021-11-19: qty 2

## 2021-11-19 MED ORDER — SODIUM CHLORIDE 0.9 % IV BOLUS
1000.0000 mL | Freq: Once | INTRAVENOUS | Status: AC
  Administered 2021-11-19: 1000 mL via INTRAVENOUS

## 2021-11-19 NOTE — Discharge Instructions (Addendum)
You may take medications as needed for dizziness and nausea (Meclizine/Zofran #20).  Return to the ER for worsening symptoms, persistent vomiting, difficulty breathing or other concerns.

## 2021-11-19 NOTE — ED Provider Notes (Signed)
[facility] Provider Note    Event Date/Time   First MD Initiated Contact with Patient 11/19/21 [phone]     (approximate)   History   Chest Pain and Dizziness   HPI  Phillip Mcdaniel is a 51 y.o. male brought to the ED via EMS from home with a chief complaint of chest pain, dizziness and altered mental status.  Patient with a history of hypertension, hepatitis C, substance abuse sober x6 months, daily EtOH without history of DTs.  Patient took a nap and woke up around 8 PM to eat dinner when he experienced sudden onset of chest discomfort, dizziness and altered mentation.  Wife did not notice facial droop, slurred speech or extremity weakness.  She did note patient to be altered from baseline.  Patient describes both room spinning as well as feeling lightheaded.  No associated diaphoresis, shortness of breath, palpitations, nausea or vomiting.  Reports recent cold-like symptoms with dry cough but denies fever.     Past Medical History   Past Medical History:  Diagnosis Date   AVN (avascular necrosis of bone) (HCC)    RIGHT hip   Hypertension      Active Problem List   Patient Active Problem List   Diagnosis Date Noted   Substance-induced psychotic disorder ([place]) 05/06/2020   Polysubstance abuse ([place]) 05/03/2020   Depression 05/03/2020     Past Surgical History   Past Surgical History:  Procedure Laterality Date   HIP SURGERY     RIGHT hip replacement     Home Medications   Prior to Admission medications   Medication Sig Start Date End Date Taking? Authorizing Provider  meclizine (ANTIVERT) 25 MG tablet Take 1 tablet (25 mg total) by mouth 3 (three) times daily as needed for dizziness or nausea. 11/19/21  Yes Paulette Blanch, MD  ondansetron (ZOFRAN-ODT) 4 MG disintegrating tablet Take 1 tablet (4 mg total) by mouth every 8 (eight) hours as needed for nausea or vomiting. 11/19/21  Yes Paulette Blanch, MD  ipratropium (ATROVENT) 0.06 % nasal spray  Place 2 sprays into both nostrils 4 (four) times daily as needed for rhinitis. 01/22/19 03/18/20  Coral Spikes, DO     Allergies  Patient has no known allergies.   Family History  History reviewed. No pertinent family history.   Physical Exam  Triage Vital Signs: ED Triage Vitals  Enc Vitals Group     BP 11/18/21 2200 140/90     Pulse Rate 11/18/21 2200 80     Resp 11/18/21 2200 18     Temp 11/18/21 2200 98.8 F (37.1 C)     Temp Source 11/18/21 2200 Oral     SpO2 11/18/21 2200 93 %     Weight 11/18/21 2201 180 lb (81.6 kg)     Height 11/18/21 2201 5\' 7"  (1.702 m)     Head Circumference --      Peak Flow --      Pain Score 11/18/21 2201 9     Pain Loc --      Pain Edu? --      Excl. in [place]? --     Updated Vital Signs: BP 124/82 (BP Location: Left Arm)    Pulse 62    Temp 98.8 F (37.1 C) (Oral)    Resp 16    Ht 5\' 7"  (1.702 m)    Wt 81.6 kg    SpO2 92%    BMI 28.19 kg/m  General: Awake, no distress.  CV:  RRR.  Good peripheral perfusion.  Resp:  Normal effort.  CTAB. Abd:  Nontender to light or deep palpation.  No distention.  Other:  Alert and oriented x3.  CN II to XII grossly intact.  No carotid bruits.  Supple neck without meningismus.  5/5 motor strength and sensation all extremities. MAEx4.   ED Results / Procedures / Treatments  Labs (all labs ordered are listed, but only abnormal results are displayed) Labs Reviewed  BASIC METABOLIC PANEL - Abnormal; Notable for the following components:      Result Value   Glucose, Bld 149 (*)    All other components within normal limits  HEPATIC FUNCTION PANEL - Abnormal; Notable for the following components:   AST 65 (*)    ALT 103 (*)    All other components within normal limits  URINE DRUG SCREEN, QUALITATIVE ([facility] ONLY) - Abnormal; Notable for the following components:   Cannabinoid 50 Ng, Ur [place] POSITIVE (*)    All other components within normal limits  CBC  LIPASE, BLOOD  ETHANOL  AMMONIA  TROPONIN I  (HIGH SENSITIVITY)  TROPONIN I (HIGH SENSITIVITY)     EKG  ED ECG REPORT I, Davie Claud J, the attending physician, personally viewed and interpreted this ECG.   Date: 11/19/2021  EKG Time: 2156  Rate: 82  Rhythm: normal sinus rhythm  Axis: Normal  Intervals:none  ST&T Change: Nonspecific    RADIOLOGY Have personally reviewed patient's CT head, chest x-ray and MRI brain as well as the radiology interpretation:  CT head: No ICH  Chest x-ray: No acute cardiopulmonary process  MRI brain: Normal  Official radiology report(s): DG Chest 2 View  Result Date: 11/18/2021 CLINICAL DATA:  Chest pain EXAM: CHEST - 2 VIEW COMPARISON:  11/18/2021 FINDINGS: Heart and mediastinal contours are within normal limits. No focal opacities or effusions. No acute bony abnormality. IMPRESSION: No active cardiopulmonary disease. Electronically Signed   By: Rolm Baptise M.D.   On: 11/18/2021 22:24   CT HEAD WO CONTRAST (5MM)  Result Date: 11/18/2021 CLINICAL DATA:  Dizziness EXAM: CT HEAD WITHOUT CONTRAST TECHNIQUE: Contiguous axial images were obtained from the base of the skull through the vertex without intravenous contrast. RADIATION DOSE REDUCTION: This exam was performed according to the departmental dose-optimization program which includes automated exposure control, adjustment of the mA and/or kV according to patient size and/or use of iterative reconstruction technique. COMPARISON:  CT brain 07/06/2007 FINDINGS: Brain: No acute territorial infarction, hemorrhage or intracranial mass. Mild atrophy. Nonenlarged ventricles Vascular: No hyperdense vessels.  No unexpected calcification Skull: Normal. Negative for fracture or focal lesion. Sinuses/Orbits: No acute finding. Other: None IMPRESSION: Negative non contrasted CT appearance of the brain. Electronically Signed   By: Donavan Foil M.D.   On: 11/18/2021 22:34   MR BRAIN WO CONTRAST  Result Date: 11/19/2021 CLINICAL DATA:  51 year old male with  sudden onset chest pain, dizziness, altered mental status. Blurred vision. EXAM: MRI HEAD WITHOUT CONTRAST TECHNIQUE: Multiplanar, multiecho pulse sequences of the brain and surrounding structures were obtained without intravenous contrast. COMPARISON:  Head CT 10/18/2022. FINDINGS: Brain: No restricted diffusion to suggest acute infarction. No midline shift, mass effect, evidence of mass lesion, ventriculomegaly, extra-axial collection or acute intracranial hemorrhage. Cervicomedullary junction and pituitary are within normal limits. Pearline Cables and white matter signal is within normal limits throughout the brain. No encephalomalacia or chronic cerebral blood products identified. Vascular: Major intracranial vascular flow voids are preserved. The left vertebral artery  appears dominant. Skull and upper cervical spine: Negative. Visualized bone marrow signal is within normal limits. Sinuses/Orbits: Orbits appear symmetric and normal. Mild paranasal sinus mucosal thickening, primarily in the frontoethmoidal recesses. No sinus fluid level. Other: Mastoids are clear. Visible internal auditory structures appear normal. Negative visible scalp and face. IMPRESSION: 1. Normal noncontrast MRI appearance of the brain. 2. Minor paranasal sinus inflammation, significance doubtful. Electronically Signed   By: Genevie Ann M.D.   On: 11/19/2021 04:54     PROCEDURES:  Critical Care performed: No  .1-3 Lead EKG Interpretation Performed by: Paulette Blanch, MD Authorized by: Paulette Blanch, MD     Interpretation: normal     ECG rate:  60   ECG rate assessment: normal     Rhythm: sinus rhythm     Ectopy: none     Conduction: normal   Comments:     Patient placed on cardiac monitor to evaluate for arrhythmias  NIH Stroke Scale  Interval: Baseline Time: 6:09 AM Person Administering Scale: Tessie Ordaz J  Administer stroke scale items in the order listed. Record performance in each category after each subscale exam. Do not go back  and change scores. Follow directions provided for each exam technique. Scores should reflect what the patient does, not what the clinician thinks the patient can do. The clinician should record answers while administering the exam and work quickly. Except where indicated, the patient should not be coached (i.e., repeated requests to patient to make a special effort).   1a  Level of consciousness: 0=alert; keenly responsive  1b. LOC questions:  0=Performs both tasks correctly  1c. LOC commands: 0=Performs both tasks correctly  2.  Best Gaze: 0=normal  3.  Visual: 0=No visual loss  4. Facial Palsy: 0=Normal symmetric movement  5a.  Motor left arm: 0=No drift, limb holds 90 (or 45) degrees for full 10 seconds  5b.  Motor right arm: 0=No drift, limb holds 90 (or 45) degrees for full 10 seconds  6a. motor left leg: 0=No drift, limb holds 90 (or 45) degrees for full 10 seconds  6b  Motor right leg:  0=No drift, limb holds 90 (or 45) degrees for full 10 seconds  7. Limb Ataxia: 0=Absent  8.  Sensory: 0=Normal; no sensory loss  9. Best Language:  0=No aphasia, normal  10. Dysarthria: 0=Normal  11. Extinction and Inattention: 0=No abnormality  12. Distal motor function: 0=Normal   Total:   0     MEDICATIONS ORDERED IN ED: Medications  sodium chloride 0.9 % bolus 1,000 mL (0 mLs Intravenous Stopped 11/19/21 0303)  ondansetron (ZOFRAN) injection 4 mg (4 mg Intravenous Given 11/19/21 0144)  diazepam (VALIUM) injection 2 mg (2 mg Intravenous Given 11/19/21 0145)     IMPRESSION / MDM / ASSESSMENT AND PLAN / ED COURSE  I reviewed the triage vital signs and the nursing notes.                             51 year old male presenting with chest pain and dizziness. Differential diagnosis includes, but is not limited to, ACS, aortic dissection, pulmonary embolism, cardiac tamponade, pneumothorax, pneumonia, pericarditis, myocarditis, GI-related causes including esophagitis/gastritis, and musculoskeletal  chest wall pain.   I have personally reviewed patient's chart and note his PCP and infectious disease office visits for chronic hepatitis C from 09/20/2021 and 11/08/2021.  The patient is on the cardiac monitor to evaluate for evidence of arrhythmia and/or  significant heart rate changes.   Laboratory results remarkable for normal WBC, normal electrolytes, initial troponin negative.  CT head demonstrates no ICH.  Chest x-ray clear.  Will add LFTs, EtOH, ammonia, repeat troponin, UDS.  Obtain orthostatic vital signs.  Initiate IV fluid hydration, Valium for dizziness, Zofran for nausea.  Will reassess.  Clinical Course as of 11/19/21 [id number]  Sun Nov 19, 2021  0441 Delay secondary to MRI.  Patient was not orthostatic.  Currently in MRI. [JS]  [phone] Updated patient and spouse on normal MRI results.  There is no focal neurological deficits.  He is overall feeling better.  Will discharge home with as needed prescriptions for meclizine and Zofran, and patient will follow up closely with his PCP.  Strict return precautions given.  Both verbalized understanding and agree with plan of care. [JS]    Clinical Course User Index [JS] Paulette Blanch, MD     FINAL CLINICAL IMPRESSION(S) / ED DIAGNOSES   Final diagnoses:  Dizziness  Chest pain, unspecified type  Marijuana use     Rx / DC Orders   ED Discharge Orders          Ordered    meclizine (ANTIVERT) 25 MG tablet  3 times daily PRN        11/19/21 0459    ondansetron (ZOFRAN-ODT) 4 MG disintegrating tablet  Every 8 hours PRN        11/19/21 0459             Note:  This document was prepared using Dragon voice recognition software and may include unintentional dictation errors.   Paulette Blanch, MD 11/19/21 [phone]

## 2022-03-06 ENCOUNTER — Emergency Department
Admission: EM | Admit: 2022-03-06 | Discharge: 2022-03-07 | Disposition: A | Discharge status: Home / Self Care | Payer: Self-pay | Attending: Emergency Medicine | Admitting: Emergency Medicine

## 2022-03-06 DIAGNOSIS — R456 Violent behavior: Secondary | ICD-10-CM | POA: Insufficient documentation

## 2022-03-06 DIAGNOSIS — F1910 Other psychoactive substance abuse, uncomplicated: Secondary | ICD-10-CM | POA: Diagnosis present

## 2022-03-06 DIAGNOSIS — F19959 Other psychoactive substance use, unspecified with psychoactive substance-induced psychotic disorder, unspecified: Secondary | ICD-10-CM | POA: Diagnosis present

## 2022-03-06 DIAGNOSIS — F32A Depression, unspecified: Secondary | ICD-10-CM | POA: Insufficient documentation

## 2022-03-06 DIAGNOSIS — F19159 Other psychoactive substance abuse with psychoactive substance-induced psychotic disorder, unspecified: Secondary | ICD-10-CM | POA: Insufficient documentation

## 2022-03-06 DIAGNOSIS — R4689 Other symptoms and signs involving appearance and behavior: Secondary | ICD-10-CM

## 2022-03-06 DIAGNOSIS — Z20822 Contact with and (suspected) exposure to covid-19: Secondary | ICD-10-CM | POA: Insufficient documentation

## 2022-03-06 LAB — URINE DRUG SCREEN, QUALITATIVE (ARMC ONLY)
Amphetamines, Ur Screen: POSITIVE — AB
Barbiturates, Ur Screen: NOT DETECTED
Benzodiazepine, Ur Scrn: NOT DETECTED
Cannabinoid 50 Ng, Ur ~~LOC~~: POSITIVE — AB
Cocaine Metabolite,Ur ~~LOC~~: NOT DETECTED
MDMA (Ecstasy)Ur Screen: NOT DETECTED
Methadone Scn, Ur: NOT DETECTED
Opiate, Ur Screen: NOT DETECTED
Phencyclidine (PCP) Ur S: NOT DETECTED
Tricyclic, Ur Screen: NOT DETECTED

## 2022-03-06 LAB — COMPREHENSIVE METABOLIC PANEL
ALT: 24 U/L (ref 0–44)
AST: 36 U/L (ref 15–41)
Albumin: 4.2 g/dL (ref 3.5–5.0)
Alkaline Phosphatase: 71 U/L (ref 38–126)
Anion gap: 12 (ref 5–15)
BUN: 17 mg/dL (ref 6–20)
CO2: 22 mmol/L (ref 22–32)
Calcium: 9.7 mg/dL (ref 8.9–10.3)
Chloride: 105 mmol/L (ref 98–111)
Creatinine, Ser: 0.92 mg/dL (ref 0.61–1.24)
GFR, Estimated: >60 mL/min (ref >60)
Glucose, Bld: 100 mg/dL — ABNORMAL HIGH (ref 70–99)
Potassium: 3.3 mmol/L — ABNORMAL LOW (ref 3.5–5.1)
Sodium: 139 mmol/L (ref 135–145)
Total Bilirubin: 2.4 mg/dL — ABNORMAL HIGH (ref 0.3–1.2)
Total Protein: 8.2 g/dL — ABNORMAL HIGH (ref 6.5–8.1)

## 2022-03-06 LAB — CBC
HCT: 45.7 % (ref 39.0–52.0)
Hemoglobin: 16 g/dL (ref 13.0–17.0)
MCH: 32.8 pg (ref 26.0–34.0)
MCHC: 35 g/dL (ref 30.0–36.0)
MCV: 93.6 fL (ref 80.0–100.0)
Platelets: 153 10*3/uL (ref 150–400)
RBC: 4.88 MIL/uL (ref 4.22–5.81)
RDW: 13 % (ref 11.5–15.5)
WBC: 7.1 10*3/uL (ref 4.0–10.5)
nRBC: 0 % (ref 0.0–0.2)

## 2022-03-06 LAB — RESP PANEL BY RT-PCR (FLU A&B, COVID) ARPGX2
Influenza A by PCR: NEGATIVE
Influenza B by PCR: NEGATIVE
SARS Coronavirus 2 by RT PCR: NEGATIVE

## 2022-03-06 LAB — ACETAMINOPHEN LEVEL: Acetaminophen (Tylenol), Serum: <10 ug/mL — ABNORMAL LOW (ref 10–30)

## 2022-03-06 LAB — SALICYLATE LEVEL: Salicylate Lvl: <7 mg/dL — ABNORMAL LOW (ref 7.0–30.0)

## 2022-03-06 LAB — ETHANOL: Alcohol, Ethyl (B): <10 mg/dL (ref <10)

## 2022-03-06 MED ORDER — MECLIZINE HCL 25 MG PO TABS
25.0000 mg | ORAL_TABLET | Freq: Three times a day (TID) | ORAL | Status: DC | PRN
  Filled 2022-03-06: qty 1

## 2022-03-06 MED ORDER — SERTRALINE HCL 50 MG PO TABS
50.0000 mg | ORAL_TABLET | Freq: Every day | ORAL | Status: DC
  Administered 2022-03-06: 50 mg via ORAL
  Filled 2022-03-06: qty 1

## 2022-03-06 MED ORDER — GLECAPREVIR-PIBRENTASVIR 100-40 MG PO TABS: 3.0000 | ORAL_TABLET | Freq: Every day | ORAL | Status: DC

## 2022-03-06 MED ORDER — TRAZODONE HCL 100 MG PO TABS
100.0000 mg | ORAL_TABLET | Freq: Every day | ORAL | Status: DC
Start: 2022-03-06 — End: 2022-03-07

## 2022-03-06 MED ORDER — ONDANSETRON 4 MG PO TBDP: 4.0000 mg | ORAL_TABLET | Freq: Three times a day (TID) | ORAL | Status: DC | PRN

## 2022-03-06 MED ORDER — AMLODIPINE BESYLATE 5 MG PO TABS
10.0000 mg | ORAL_TABLET | Freq: Every day | ORAL | Status: DC
  Administered 2022-03-06: 10 mg via ORAL
  Filled 2022-03-06: qty 2

## 2022-03-06 NOTE — ED Notes (Signed)
IVC PENDING  CONSULT ?

## 2022-03-06 NOTE — ED Notes (Signed)
Pt denies SI, endorses VH.  Pt states he is stressed with his job and family.  Pt also states he has not been eating or sleeping.  Calm and cooperative with staff.   ?

## 2022-03-06 NOTE — BH Assessment (Signed)
Comprehensive Clinical Assessment (CCA) Screening, Triage and Referral Note ? ?03/06/2022 ?Phillip Mcdaniel ?[id number] ? ?Phillip Mcdaniel, 51 year old During TTS assessment pt presents alert and oriented x 4, anxious but cooperative, and mood-congruent with affect. The pt does not appear to be responding to internal or external stimuli. Neither is the pt presenting with any delusional thinking. Pt verified the information provided to triage RN.  ? ?Pt identifies his main complaint to be that he has not slept in 3 days. Patient states he recently broke up with longtime girlfriend and stressed out due to his current living situation. Patient reports they were together for 25+ years and he is now living with a friend. Patient reports he is working but does not have dependable transportation which he shares with his roommate. Patient states he misses his family. Patient was tearful during assessment. Pt reports drinking 12 pack of beer and ?a few edibles.? Patient denies using other illicit substances. Pt denies having INPT hx but was taking Zoloft about 2 months ago for anxiety. Pt reports a medical hx of Hep C and high blood pressure and is compliant with his medications. Pt denies current SI/HI/AH/VH. Pt contracts for safety. Patient was calm and coherent during interview. Patient reports he was ?out of it? due to his lack of sleep. ?I have insomnia but I am trying to take as less medication as possible.? Patient reports he does not want to hurt himself but may need to seek therapy for his anxiety.   ? ?Disposition pending psych consult ? ?Chief Complaint:  ?Chief Complaint  ?Patient presents with  ? Psychiatric Evaluation  ? ?Visit Diagnosis: Anxiety ? ?Patient Reported Information ?How did you hear about [place]? Family/Friend ? ?What Is the Reason for Your Visit/Call Today? Per IVC paperwork, patient brought to ED for bizarre behavior. ? ?How Long Has This Been Causing You Problems? <Week ? ?What Do You Feel Would Help  You the Most Today? -- (Assessment only) ? ? ?Have You Recently Had Any Thoughts About Hurting Yourself? No ? ?Are You Planning to Commit Suicide/Harm Yourself At This time? No ? ? ?Have you Recently Had Thoughts About [place]? No ? ?Are You Planning to Harm Someone at This Time? No ? ?Explanation: No data recorded ? ?Have You Used Any Alcohol or Drugs in the Past 24 Hours? Yes ? ?How Long Ago Did You Use Drugs or Alcohol? No data recorded ?What Did You Use and How Much? Alcohol and marijuana ? ? ?Do You Currently Have a Therapist/Psychiatrist? No ? ?Name of Therapist/Psychiatrist: No data recorded ? ?Have You Been Recently Discharged From Any Office Practice or Programs? No ? ?Explanation of Discharge From Practice/Program: No data recorded ?  ?CCA Screening Triage Referral Assessment ?Type of Contact: Face-to-Face ? ?Telemedicine Service Delivery:   ?Is this Initial or Reassessment? No data recorded ?Date Telepsych consult ordered in [facility]:  No data recorded ?Time Telepsych consult ordered in [facility]:  No data recorded ?Location of Assessment: [facility] ED ? ?Provider Location: [facility] ED ? ? ?Collateral Involvement: None provided ? ? ?Does Patient Have a Stage manager Guardian? No data recorded ?Name and Contact of Legal Guardian: No data recorded ?If Minor and Not Living with Parent(s), Who has Custody? n/a ? ?Is CPS involved or ever been involved? Never ? ?Is APS involved or ever been involved? Never ? ? ?Patient Determined To Be At Risk for Harm To Self or Others Based on Review of Patient Reported Information or Presenting  Complaint? No ? ?Method: No data recorded ?Availability of Means: No data recorded ?Intent: No data recorded ?Notification Required: No data recorded ?Additional Information for Danger to Others Potential: No data recorded ?Additional Comments for Danger to Others Potential: No data recorded ?Are There Guns or Other Weapons in [place]? No data recorded ?Types of Guns/Weapons: No  data recorded ?Are These Weapons Safely Secured?                            No data recorded ?Who Could Verify You Are Able To Have These Secured: No data recorded ?Do You Have any Outstanding Charges, Pending Court Dates, Parole/Probation? No data recorded ?Contacted To Inform of Risk of Harm To Self or Others: No data recorded ? ?Does Patient Present under Involuntary Commitment? Yes ? ?IVC Papers Initial File Date: 03/06/22 ? ? ?[place] of Residence: [place] ? ? ?Patient Currently Receiving the Following Services: Not Receiving Services ? ? ?Determination of Need: Emergent (2 hours) ? ? ?Options For Referral: ED Visit; Medication Management; Outpatient Therapy ? ? ?Discharge Disposition:  ?  ? ?Quillan Whitter Glennon Mac, Counselor, LCAS-A ? ? ?  ?  ?  ? ? ?

## 2022-03-06 NOTE — ED Notes (Signed)
Pt concerned he has not had his BP or Hep C medications today.   ?

## 2022-03-06 NOTE — ED Notes (Signed)
IVC  FROM  [facility] 

## 2022-03-06 NOTE — ED Triage Notes (Signed)
Patient brought to the ER under IVC from [place].  Pt has reportedly been using meth, hallucinating and has not been eating or sleeping for 2-3 days.  ?

## 2022-03-06 NOTE — ED Notes (Signed)
Pt dressed out into burgundy scrubs by EDT Shawn. ? ?Jeans with belt ?Shirt ?Shoes ?Underwear ?Wallet  ?cellphone ?

## 2022-03-06 NOTE — ED Notes (Signed)
IVC/Consult Pending/Plan to monitor overnight/ Moved to [facility]-1 ?

## 2022-03-06 NOTE — ED Provider Notes (Signed)
? ?[facility] ?Provider Note ? ? ? Event Date/Time  ? First MD Initiated Contact with Patient 03/06/22 1603   ?  (approximate) ? ? ?History  ? ?Psychiatric Evaluation ? ? ?HPI ? ?Phillip Mcdaniel is a 51 y.o. male  who, per family medicine note dated 12/25/2021 has history of anxiety, who presents to the emergency department under IVC. The patient himself states that he has been very stressed recently.  He has recently broken up with his wife.  Additionally he suffers from insomnia and states that it is not unusual for him to go without sleeping for many nights in a row.  He has not been able to sleep recently.  Because of this he feels like it starting to think outside of his head.  Apparently his roommate became concerned about his behavior so took out IVC paperwork. Patient denies any recent illness.   ? ? ?Physical Exam  ? ?Triage Vital Signs: ?ED Triage Vitals  ?Enc Vitals Group  ?   BP 03/06/22 1601 (!) 162/109  ?   Pulse Rate 03/06/22 1601 72  ?   Resp 03/06/22 1601 18  ?   Temp 03/06/22 1601 97.9 ?F (36.6 ?C)  ?   Temp Source 03/06/22 1601 Oral  ?   SpO2 03/06/22 1601 100 %  ?   Weight 03/06/22 1602 180 lb (81.6 kg)  ?   Height 03/06/22 1602 5\' 7"  (1.702 m)  ?   Head Circumference --   ?   Peak Flow --   ?   Pain Score 03/06/22 1549 0  ? ?Most recent vital signs: ?Vitals:  ? 03/06/22 1601  ?BP: (!) 162/109  ?Pulse: 72  ?Resp: 18  ?Temp: 97.9 ?F (36.6 ?C)  ?SpO2: 100%  ? ?General: Awake, alert. Anxious. ?CV:  Good peripheral perfusion. Regular rate and rhythm. ?Resp:  Normal effort. Lungs clear. ?Abd:  No distention.  ?Psych:  Anxious. ? ? ?ED Results / Procedures / Treatments  ? ?Labs ?(all labs ordered are listed, but only abnormal results are displayed) ?Labs Reviewed  ?COMPREHENSIVE METABOLIC PANEL - Abnormal; Notable for the following components:  ?    Result Value  ? Potassium 3.3 (*)   ? Glucose, Bld 100 (*)   ? Total Protein 8.2 (*)   ? Total Bilirubin 2.4 (*)   ? All other  components within normal limits  ?SALICYLATE LEVEL - Abnormal; Notable for the following components:  ? Salicylate Lvl [id number] (*)   ? All other components within normal limits  ?ACETAMINOPHEN LEVEL - Abnormal; Notable for the following components:  ? Acetaminophen (Tylenol), Serum <10 (*)   ? All other components within normal limits  ?ETHANOL  ?CBC  ?URINE DRUG SCREEN, QUALITATIVE ([facility] ONLY)  ? ? ? ?EKG ? ?None ? ? ?RADIOLOGY ?None ? ? ?PROCEDURES: ? ?Critical Care performed: No ? ?Procedures ? ? ?MEDICATIONS ORDERED IN ED: ?Medications - No data to display ? ? ?IMPRESSION / MDM / ASSESSMENT AND PLAN / ED COURSE  ?I reviewed the triage vital signs and the nursing notes. ?             ?               ? ?Differential diagnosis includes, but is not limited to, insomnia, drug use, psychiatric illness. ? ?Patient presented to the emergency department today because of concerns for altered mental status.  Patient was IVC by roommate.  On exam patient does appear anxious.  This time will continue IVC and have psychiatry evaluate. ? ?The patient has been placed in psychiatric observation due to the need to provide a safe environment for the patient while obtaining psychiatric consultation and evaluation, as well as ongoing medical and medication management to treat the patient's condition.  The patient has been placed under full IVC at this time. ? ? ? ?  ? ? ?FINAL CLINICAL IMPRESSION(S) / ED DIAGNOSES  ? ?Final diagnoses:  ?Abnormal behavior  ? ? ? ?Note:  This document was prepared using Dragon voice recognition software and may include unintentional dictation errors. ? ?  ?Nance Pear, MD ?03/06/22 1919 ? ?

## 2022-03-06 NOTE — ED Notes (Signed)
Pt given cup to provide urine sample 

## 2022-03-06 NOTE — ED Notes (Signed)
EDP made aware pt's home meds had been reconciled by pharmacy.   ?

## 2022-03-06 NOTE — ED Notes (Signed)
Phillip Lukes NP states taht urine is needed for pt med order for sleep. Pt informed and goes to restroom, exits after flushing toilet and has minimal drops in cup, states that is all he can urinate. Annice Pih, NP is notified, lab is called to give heads up of situation, will return call if it is not enough to process for UDS ?

## 2022-03-06 NOTE — ED Notes (Signed)
Report received from Amy, RN including SBAR. Patient alert and oriented, warm and dry, and in no acute distress. Patient denies SI, HI, AVH and pain. Patient made aware of Q15 minute rounds and Rover and Officer presence for their safety. Patient instructed to come to this nurse with needs or concerns.  °

## 2022-03-07 ENCOUNTER — Other Ambulatory Visit: Payer: Self-pay

## 2022-03-07 DIAGNOSIS — F19959 Other psychoactive substance use, unspecified with psychoactive substance-induced psychotic disorder, unspecified: Secondary | ICD-10-CM

## 2022-03-07 MED ORDER — GLECAPREVIR-PIBRENTASVIR 100-40 MG PO TABS: 3.0000 | ORAL_TABLET | Freq: Every day | ORAL | Status: DC

## 2022-03-07 NOTE — Consult Note (Signed)
[facility] Psych ED Progress Note  03/07/2022 9:39 AM Phillip Mcdaniel  MRN:  [id number]   Method of visit?: Face to Face    Principal Problem: Substance-induced psychotic disorder [facility]) Diagnosis:  Principal Problem:   Substance-induced psychotic disorder ([place]) Active Problems:   Polysubstance abuse ([place])   Depression    Total Time spent with patient: 30 minutes  Subjective:  "I have never had thoughts of killing myself" Patient is alert and oriented x4.  He admits to using methamphetamine and cannabis within the last 2 days.  He states that he has been very upset over the break-up of his 20+ year relationship.  Friends and his roommate have reached out to him since he has been here and this has made him feel very supported.  Patient denies suicidal thoughts.  Denies homicidal thoughts.  Patient is not responding to internal stimuli.  Denies auditory or visual hallucinations or paranoia.  He speaks in clear, coherent sentences.  Patient is a patient with [facility] care, where he gets treatment for hepatitis C.  He has also had some behavioral health appointments, which he has missed, but states that he will follow up with.  Patient also given information about [facility] services for mental health and substance abuse.  Patient is requesting to discharge.  Patient no longer meets criteria for involuntary commitment.  IVC released.  Past Psychiatric History: depression, polysubstance abuse  Past Medical History:  Past Medical History:  Diagnosis Date   AVN (avascular necrosis of bone) ([place])    RIGHT hip   Hypertension     Past Surgical History:  Procedure Laterality Date   HIP SURGERY     RIGHT hip replacement   Family History: History reviewed. No pertinent family history. Family Psychiatric  History:  Social History:  Social History   Substance and Sexual Activity  Alcohol Use Yes   Comment: daily, at least 12 beers a day.     Social History   Substance and Sexual Activity  Drug  Use Yes   Types: Cocaine, Marijuana, Methamphetamines    Social History   Socioeconomic History   Marital status: Divorced    Spouse name: Not on file   Number of children: Not on file   Years of education: Not on file   Highest education level: Not on file  Occupational History   Not on file  Tobacco Use   Smoking status: Never   Smokeless tobacco: Never  Vaping Use   Vaping Use: Never used  Substance and Sexual Activity   Alcohol use: Yes    Comment: daily, at least 12 beers a day.   Drug use: Yes    Types: Cocaine, Marijuana, Methamphetamines   Sexual activity: Not on file  Other Topics Concern   Not on file  Social History Narrative   Not on file   Social Determinants of Health   Financial Resource Strain: Not on file  Food Insecurity: Not on file  Transportation Needs: Not on file  Physical Activity: Not on file  Stress: Not on file  Social Connections: Not on file    Sleep: Fair  Appetite:  Good  Current Medications: Current Facility-Administered Medications  Medication Dose Route Frequency Provider Last Rate Last Admin   amLODipine (NORVASC) tablet 10 mg  10 mg Oral Daily Nance Pear, MD   10 mg at 03/06/22 2002   Glecaprevir-Pibrentasvir 100-40 MG TABS 3 tablet  3 tablet Oral Daily Nance Pear, MD       meclizine (  ANTIVERT) tablet 25 mg  25 mg Oral TID PRN Nance Pear, MD       ondansetron (ZOFRAN-ODT) disintegrating tablet 4 mg  4 mg Oral Q8H PRN Nance Pear, MD       sertraline (ZOLOFT) tablet 50 mg  50 mg Oral Daily Nance Pear, MD   50 mg at 03/06/22 2002   traZODone (DESYREL) tablet 100 mg  100 mg Oral QHS Caroline Sauger, NP       Current Outpatient Medications  Medication Sig Dispense Refill   amLODipine (NORVASC) 10 MG tablet Take 10 mg by mouth daily.     Glecaprevir-Pibrentasvir 100-40 MG TABS Take 3 tablets by mouth daily.     sertraline (ZOLOFT) 50 MG tablet Take 50 mg by mouth daily.     meclizine (ANTIVERT)  25 MG tablet Take 1 tablet (25 mg total) by mouth 3 (three) times daily as needed for dizziness or nausea. 20 tablet 0   ondansetron (ZOFRAN-ODT) 4 MG disintegrating tablet Take 1 tablet (4 mg total) by mouth every 8 (eight) hours as needed for nausea or vomiting. 20 tablet 0    Lab Results:  Results for orders placed or performed during the hospital encounter of 03/06/22 (from the past 48 hour(s))  Comprehensive metabolic panel     Status: Abnormal   Collection Time: 03/06/22  4:09 PM  Result Value Ref Range   Sodium 139 135 - 145 mmol/L   Potassium 3.3 (L) 3.5 - 5.1 mmol/L   Chloride 105 98 - 111 mmol/L   CO2 22 22 - 32 mmol/L   Glucose, Bld 100 (H) 70 - 99 mg/dL    Comment: Glucose reference range applies only to samples taken after fasting for at least 8 hours.   BUN 17 6 - 20 mg/dL   Creatinine, Ser 0.92 0.61 - 1.24 mg/dL   Calcium 9.7 8.9 - 10.3 mg/dL   Total Protein 8.2 (H) 6.5 - 8.1 g/dL   Albumin 4.2 3.5 - 5.0 g/dL   AST 36 15 - 41 U/L   ALT 24 0 - 44 U/L   Alkaline Phosphatase 71 38 - 126 U/L   Total Bilirubin 2.4 (H) 0.3 - 1.2 mg/dL   GFR, Estimated >60 >60 mL/min    Comment: (NOTE) Calculated using the CKD-EPI Creatinine Equation (2021)    Anion gap 12 5 - 15    Comment: Performed at [facility], [address] [place], [place] [address]  Ethanol     Status: None   Collection Time: 03/06/22  4:09 PM  Result Value Ref Range   Alcohol, Ethyl (B) <10 <10 mg/dL    Comment: (NOTE) Lowest detectable limit for serum alcohol is 10 mg/dL.  For medical purposes only. Performed at [facility], [address] [place], [place] [id number]   Salicylate level     Status: Abnormal   Collection Time: 03/06/22  4:09 PM  Result Value Ref Range   Salicylate Lvl [id number] (L) 7.0 - 30.0 mg/dL    Comment: Performed at [facility], [address] [place], [place] [address]  Acetaminophen level     Status: Abnormal   Collection Time: 03/06/22  4:09 PM   Result Value Ref Range   Acetaminophen (Tylenol), Serum <10 (L) 10 - 30 ug/mL    Comment: (NOTE) Therapeutic concentrations vary significantly. A range of 10-30 ug/mL  may be an effective concentration for many patients. However, some  are best treated at concentrations outside of this range. Acetaminophen  concentrations >150 ug/mL at 4 hours after ingestion  and >50 ug/mL at 12 hours after ingestion are often associated with  toxic reactions.  Performed at [facility], [address] [place], [place] [address]   cbc     Status: None   Collection Time: 03/06/22  4:09 PM  Result Value Ref Range   WBC 7.1 4.0 - 10.5 K/uL   RBC 4.88 4.22 - 5.81 MIL/uL   Hemoglobin 16.0 13.0 - 17.0 g/dL   HCT 45.7 39.0 - 52.0 %   MCV 93.6 80.0 - 100.0 fL   MCH 32.8 26.0 - 34.0 pg   MCHC 35.0 30.0 - 36.0 g/dL   RDW 13.0 11.5 - 15.5 %   Platelets 153 150 - 400 K/uL   nRBC 0.0 0.0 - 0.2 %    Comment: Performed at [facility], [address], [place], [place] [address]  Urine Drug Screen, Qualitative     Status: Abnormal   Collection Time: 03/06/22 10:18 PM  Result Value Ref Range   Tricyclic, Ur Screen NONE DETECTED NONE DETECTED   Amphetamines, Ur Screen POSITIVE (A) NONE DETECTED   MDMA (Ecstasy)Ur Screen NONE DETECTED NONE DETECTED   Cocaine Metabolite,Ur [place] NONE DETECTED NONE DETECTED   Opiate, Ur Screen NONE DETECTED NONE DETECTED   Phencyclidine (PCP) Ur S NONE DETECTED NONE DETECTED   Cannabinoid 50 Ng, Ur [place] POSITIVE (A) NONE DETECTED   Barbiturates, Ur Screen NONE DETECTED NONE DETECTED   Benzodiazepine, Ur Scrn NONE DETECTED NONE DETECTED   Methadone Scn, Ur NONE DETECTED NONE DETECTED    Comment: (NOTE) Tricyclics + metabolites, urine    Cutoff 1000 ng/mL Amphetamines + metabolites, urine  Cutoff 1000 ng/mL MDMA (Ecstasy), urine              Cutoff 500 ng/mL Cocaine Metabolite, urine          Cutoff 300 ng/mL Opiate + metabolites, urine        Cutoff 300  ng/mL Phencyclidine (PCP), urine         Cutoff 25 ng/mL Cannabinoid, urine                 Cutoff 50 ng/mL Barbiturates + metabolites, urine  Cutoff 200 ng/mL Benzodiazepine, urine              Cutoff 200 ng/mL Methadone, urine                   Cutoff 300 ng/mL  The urine drug screen provides only a preliminary, unconfirmed analytical test result and should not be used for non-medical purposes. Clinical consideration and professional judgment should be applied to any positive drug screen result due to possible interfering substances. A more specific alternate chemical method must be used in order to obtain a confirmed analytical result. Gas chromatography / mass spectrometry (GC/MS) is the preferred confirm atory method. Performed at [facility], [address] [place], [place] [address]   Resp Panel by RT-PCR (Flu A&B, Covid) Nasopharyngeal Swab     Status: None   Collection Time: 03/06/22 10:18 PM   Specimen: Nasopharyngeal Swab; Nasopharyngeal(NP) swabs in vial transport medium  Result Value Ref Range   SARS Coronavirus 2 by RT PCR NEGATIVE NEGATIVE    Comment: (NOTE) SARS-CoV-2 target nucleic acids are NOT DETECTED.  The SARS-CoV-2 RNA is generally detectable in upper respiratory specimens during the acute phase of infection. The lowest concentration of SARS-CoV-2 viral copies this assay can detect is 138  copies/mL. A negative result does not preclude SARS-Cov-2 infection and should not be used as the sole basis for treatment or other patient management decisions. A negative result may occur with  improper specimen collection/handling, submission of specimen other than nasopharyngeal swab, presence of viral mutation(s) within the areas targeted by this assay, and inadequate number of viral copies(<138 copies/mL). A negative result must be combined with clinical observations, patient history, and epidemiological information. The expected result is  Negative.  Fact Sheet for Patients:  EntrepreneurPulse.com.au  Fact Sheet for Healthcare Providers:  IncredibleEmployment.be  This test is no t yet approved or cleared by the [place] FDA and  has been authorized for detection and/or diagnosis of SARS-CoV-2 by FDA under an Emergency Use Authorization (EUA). This EUA will remain  in effect (meaning this test can be used) for the duration of the COVID-19 declaration under Section 564(b)(1) of the Act, 21 U.S.C.section 360bbb-3(b)(1), unless the authorization is terminated  or revoked sooner.       Influenza A by PCR NEGATIVE NEGATIVE   Influenza B by PCR NEGATIVE NEGATIVE    Comment: (NOTE) The Xpert Xpress SARS-CoV-2/FLU/RSV plus assay is intended as an aid in the diagnosis of influenza from Nasopharyngeal swab specimens and should not be used as a sole basis for treatment. Nasal washings and aspirates are unacceptable for Xpert Xpress SARS-CoV-2/FLU/RSV testing.  Fact Sheet for Patients: EntrepreneurPulse.com.au  Fact Sheet for Healthcare Providers: IncredibleEmployment.be  This test is not yet approved or cleared by the [place] FDA and has been authorized for detection and/or diagnosis of SARS-CoV-2 by FDA under an Emergency Use Authorization (EUA). This EUA will remain in effect (meaning this test can be used) for the duration of the COVID-19 declaration under Section 564(b)(1) of the Act, 21 U.S.C. section 360bbb-3(b)(1), unless the authorization is terminated or revoked.  Performed at [facility], [address] [place], [place] [address]     Blood Alcohol level:  Lab Results  Component Value Date   [facility] <10 03/06/2022   ETH <10 11/19/2021    Physical Findings: AIMS:  , ,  ,  ,    CIWA:    COWS:     Musculoskeletal: Strength & Muscle Tone: within normal limits Gait & Station: normal Patient leans:  N/A  Psychiatric Specialty Exam:  Presentation  General Appearance: Appropriate for Environment  Eye Contact:Good  Speech:Clear and Coherent  Speech Volume:Normal  Handedness:Right   Mood and Affect  Mood:-- (stated "a little stressed") Affect:Appropriate  Thought Process  Thought Processes:Coherent  Descriptions of Associations:Intact  Orientation:Full (Time, Place and Person)  Thought Content:Logical  History of Schizophrenia/Schizoaffective disorder:No data recorded Duration of Psychotic Symptoms:No data recorded Hallucinations:Hallucinations: None  Ideas of Reference:None  Suicidal Thoughts:Suicidal Thoughts: No  Homicidal Thoughts:Homicidal Thoughts: No   Sensorium  Memory:Immediate Good  Judgment:Poor  Insight:Fair   Executive Functions  Concentration:Fair  Attention Span:Fair  [place] of Knowledge:Good  Language:Good   Psychomotor Activity  Psychomotor Activity:Psychomotor Activity: Normal   Assets  Assets:Desire for Improvement; Housing; Social Support; Resilience; Physical Health   Sleep  Sleep:good last night   Physical Exam: Physical Exam Vitals and nursing note reviewed.  HENT:     Nose: No congestion or rhinorrhea.  Eyes:     General:        Right eye: No discharge.        Left eye: No discharge.  Cardiovascular:     Rate and Rhythm: Normal rate.  Pulmonary:  Effort: Pulmonary effort is normal.  Musculoskeletal:        General: Normal range of motion.     Cervical back: Normal range of motion.  Skin:    General: Skin is dry.  Neurological:     Mental Status: He is alert and oriented to person, place, and time.  Psychiatric:        Attention and Perception: Attention normal.        Mood and Affect: Mood normal.        Speech: Speech normal.        Behavior: Behavior normal.        Thought Content: Thought content is not paranoid or delusional. Thought content does not include homicidal or  suicidal ideation.        Cognition and Memory: Cognition normal.   Review of Systems  Psychiatric/Behavioral:  Positive for depression (stable) and substance abuse. Negative for hallucinations, memory loss and suicidal ideas. The patient is not nervous/anxious and does not have insomnia.   All other systems reviewed and are negative. Blood pressure (!) 141/108, pulse 84, temperature 98.6 F (37 C), temperature source Oral, resp. rate 18, height 5\' 7"  (1.702 m), weight 81.6 kg, SpO2 98 %. Body mass index is 28.19 kg/m.  Treatment Plan Summary: Patient does not meet criteria for inpatient psychiatric hospitalization. He will follow up with providers through his established providers at [facility]. Also gave patient information regarding [facility]. Reviewed with EDP  Sherlon Handing, NP 03/07/2022, 9:39 AM

## 2022-03-07 NOTE — ED Notes (Signed)
During nursing assessment was A/Ox 4 .  .Phillip Mcdaniel  stated that he is not currently have thoughts or feelings of SI/HI.  Phillip Krygier reported he is not auditory or visual hallucinations, pt did admit to using drugs and stated "at my age you'd think I'd know better"  Phillip. emmette katt remorse related to behaviors.  Pt affect is appropriate to situation , eye contact is intermittent , speech is clear with normal rate and volume  with appropriate verbiage noted.  Staff addressed any feelings or concerns that have been brought up.   ?

## 2022-03-07 NOTE — Consult Note (Signed)
[facility] Face-to-Face Psychiatry Consult   Reason for Consult: Psychiatric Evaluation  Referring Physician: Dr. Derrill Kay Patient Identification: Phillip Mcdaniel MRN:  [id number] Principal Diagnosis: <principal problem not specified> Diagnosis:  Active Problems:   Polysubstance abuse (HCC)   Depression   Substance-induced psychotic disorder (HCC)   Total Time spent with patient: 1 hour  Subjective:  "I have not slept in nine days." Phillip Mcdaniel is a 51 y.o. male patient presented to [facility] ED via law enforcement under involuntary commitment status (IVC). The patient was taken to [facility] and then transported to [facility] ED. It was shared that the patient had been using meth. The patient's UDS shows him positive for Amphetamines and Cannabinoids, which the patient continues to deny substance use. It was reported that the patient had not been sleeping or eating. The patient had a recent break-up from a relationship of 25 years.  This provider saw the patient face-to-face; the chart was reviewed, and consulted with Dr. Scotty Court on 03/06/2022 due to the patient's care. It was discussed with the EDP that the patient remained under observation overnight and will be reassessed in the a.m. to determine if he meets the criteria for psychiatric inpatient admission; he could be discharged home. On evaluation, the patient is alert and oriented x4, anxious, depressed but cooperative, and mood-congruent with affect. The patient does not appear to be responding to internal or external stimuli. Neither is the patient presenting with any delusional thinking. The patient denies auditory or visual hallucinations. The patient denies any suicidal, homicidal, or self-harm ideations. The patient is not presenting with any psychotic or paranoid behaviors. During an encounter with the patient, he could answer questions appropriately. Collateral was obtained from the patient's friend Mr. Valentino Nose [phone]), who said he was  concerned about the patient this morning, leading him to go to the [place] office to get home IVC. He voiced that the patient had not been sleeping and he was hallucinating. Mr. Marlene Lard shared that he needed clarification on what was happening with the patient. Mr. Marlene Lard shared that he wanted the patient to get his needed help.  HPI: Per Dr. Derrill Kay, Phillip Mcdaniel is a 51 y.o. male  who, per family medicine note dated 12/25/2021 has history of anxiety, who presents to the emergency department under IVC. The patient himself states that he has been very stressed recently.  He has recently broken up with his wife.  Additionally he suffers from insomnia and states that it is not unusual for him to go without sleeping for many nights in a row.  He has not been able to sleep recently.  Because of this he feels like it starting to think outside of his head.  Apparently his roommate became concerned about his behavior so took out IVC paperwork. Patient denies any recent illness.    Past Psychiatric History: History reviewed. No pertinent past psychiatric history  Risk to Self:   Risk to Others:   Prior Inpatient Therapy:   Prior Outpatient Therapy:    Past Medical History:  Past Medical History:  Diagnosis Date   AVN (avascular necrosis of bone) (HCC)    RIGHT hip   Hypertension     Past Surgical History:  Procedure Laterality Date   HIP SURGERY     RIGHT hip replacement   Family History: History reviewed. No pertinent family history. Family Psychiatric  History:  Social History:  Social History   Substance and Sexual Activity  Alcohol Use Yes   Comment: daily,  at least 12 beers a day.     Social History   Substance and Sexual Activity  Drug Use Yes   Types: Cocaine, Marijuana, Methamphetamines    Social History   Socioeconomic History   Marital status: Divorced    Spouse name: Not on file   Number of children: Not on file   Years of education: Not on file   Highest  education level: Not on file  Occupational History   Not on file  Tobacco Use   Smoking status: Never   Smokeless tobacco: Never  Vaping Use   Vaping Use: Never used  Substance and Sexual Activity   Alcohol use: Yes    Comment: daily, at least 12 beers a day.   Drug use: Yes    Types: Cocaine, Marijuana, Methamphetamines   Sexual activity: Not on file  Other Topics Concern   Not on file  Social History Narrative   Not on file   Social Determinants of Health   Financial Resource Strain: Not on file  Food Insecurity: Not on file  Transportation Needs: Not on file  Physical Activity: Not on file  Stress: Not on file  Social Connections: Not on file   Additional Social History:    Allergies:  No Known Allergies  Labs:  Results for orders placed or performed during the hospital encounter of 03/06/22 (from the past 48 hour(s))  Comprehensive metabolic panel     Status: Abnormal   Collection Time: 03/06/22  4:09 PM  Result Value Ref Range   Sodium 139 135 - 145 mmol/L   Potassium 3.3 (L) 3.5 - 5.1 mmol/L   Chloride 105 98 - 111 mmol/L   CO2 22 22 - 32 mmol/L   Glucose, Bld 100 (H) 70 - 99 mg/dL    Comment: Glucose reference range applies only to samples taken after fasting for at least 8 hours.   BUN 17 6 - 20 mg/dL   Creatinine, Ser 1.61 0.61 - 1.24 mg/dL   Calcium 9.7 8.9 - 09.6 mg/dL   Total Protein 8.2 (H) 6.5 - 8.1 g/dL   Albumin 4.2 3.5 - 5.0 g/dL   AST 36 15 - 41 U/L   ALT 24 0 - 44 U/L   Alkaline Phosphatase 71 38 - 126 U/L   Total Bilirubin 2.4 (H) 0.3 - 1.2 mg/dL   GFR, Estimated >04 >54 mL/min    Comment: (NOTE) Calculated using the CKD-EPI Creatinine Equation (2021)    Anion gap 12 5 - 15    Comment: Performed at [facility], [address] [place], [place] [address]  Ethanol     Status: None   Collection Time: 03/06/22  4:09 PM  Result Value Ref Range   Alcohol, Ethyl (B) <10 <10 mg/dL    Comment: (NOTE) Lowest detectable limit for  serum alcohol is 10 mg/dL.  For medical purposes only. Performed at [facility], [address] [place], [place] [address]   Salicylate level     Status: Abnormal   Collection Time: 03/06/22  4:09 PM  Result Value Ref Range   Salicylate Lvl <7.0 (L) 7.0 - 30.0 mg/dL    Comment: Performed at [facility], [address] [place], [place] [address]  Acetaminophen level     Status: Abnormal   Collection Time: 03/06/22  4:09 PM  Result Value Ref Range   Acetaminophen (Tylenol), Serum <10 (L) 10 - 30 ug/mL    Comment: (NOTE) Therapeutic concentrations vary significantly. A range  of 10-30 ug/mL  may be an effective concentration for many patients. However, some  are best treated at concentrations outside of this range. Acetaminophen concentrations >150 ug/mL at 4 hours after ingestion  and >50 ug/mL at 12 hours after ingestion are often associated with  toxic reactions.  Performed at [facility], [address] [place], [place] [address]   cbc     Status: None   Collection Time: 03/06/22  4:09 PM  Result Value Ref Range   WBC 7.1 4.0 - 10.5 K/uL   RBC 4.88 4.22 - 5.81 MIL/uL   Hemoglobin 16.0 13.0 - 17.0 g/dL   HCT 56.3 87.5 - 64.3 %   MCV 93.6 80.0 - 100.0 fL   MCH 32.8 26.0 - 34.0 pg   MCHC 35.0 30.0 - 36.0 g/dL   RDW 32.9 51.8 - 84.1 %   Platelets 153 150 - 400 K/uL   nRBC 0.0 0.0 - 0.2 %    Comment: Performed at [facility], [address], [place], [place] [address]  Urine Drug Screen, Qualitative     Status: Abnormal   Collection Time: 03/06/22 10:18 PM  Result Value Ref Range   Tricyclic, Ur Screen NONE DETECTED NONE DETECTED   Amphetamines, Ur Screen POSITIVE (A) NONE DETECTED   MDMA (Ecstasy)Ur Screen NONE DETECTED NONE DETECTED   Cocaine Metabolite,Ur [place] NONE DETECTED NONE DETECTED   Opiate, Ur Screen NONE DETECTED NONE DETECTED   Phencyclidine (PCP) Ur S NONE DETECTED NONE DETECTED   Cannabinoid 50 Ng, Ur [place] POSITIVE  (A) NONE DETECTED   Barbiturates, Ur Screen NONE DETECTED NONE DETECTED   Benzodiazepine, Ur Scrn NONE DETECTED NONE DETECTED   Methadone Scn, Ur NONE DETECTED NONE DETECTED    Comment: (NOTE) Tricyclics + metabolites, urine    Cutoff 1000 ng/mL Amphetamines + metabolites, urine  Cutoff 1000 ng/mL MDMA (Ecstasy), urine              Cutoff 500 ng/mL Cocaine Metabolite, urine          Cutoff 300 ng/mL Opiate + metabolites, urine        Cutoff 300 ng/mL Phencyclidine (PCP), urine         Cutoff 25 ng/mL Cannabinoid, urine                 Cutoff 50 ng/mL Barbiturates + metabolites, urine  Cutoff 200 ng/mL Benzodiazepine, urine              Cutoff 200 ng/mL Methadone, urine                   Cutoff 300 ng/mL  The urine drug screen provides only a preliminary, unconfirmed analytical test result and should not be used for non-medical purposes. Clinical consideration and professional judgment should be applied to any positive drug screen result due to possible interfering substances. A more specific alternate chemical method must be used in order to obtain a confirmed analytical result. Gas chromatography / mass spectrometry (GC/MS) is the preferred confirm atory method. Performed at [facility], [address] [place], [place] [address]   Resp Panel by RT-PCR (Flu A&B, Covid) Nasopharyngeal Swab     Status: None   Collection Time: 03/06/22 10:18 PM   Specimen: Nasopharyngeal Swab; Nasopharyngeal(NP) swabs in vial transport medium  Result Value Ref Range   SARS Coronavirus 2 by RT PCR NEGATIVE NEGATIVE    Comment: (NOTE) SARS-CoV-2 target nucleic acids are NOT DETECTED.  The SARS-CoV-2 RNA is  generally detectable in upper respiratory specimens during the acute phase of infection. The lowest concentration of SARS-CoV-2 viral copies this assay can detect is 138 copies/mL. A negative result does not preclude SARS-Cov-2 infection and should not be used as the sole basis for  treatment or other patient management decisions. A negative result may occur with  improper specimen collection/handling, submission of specimen other than nasopharyngeal swab, presence of viral mutation(s) within the areas targeted by this assay, and inadequate number of viral copies(<138 copies/mL). A negative result must be combined with clinical observations, patient history, and epidemiological information. The expected result is Negative.  Fact Sheet for Patients:  BloggerCourse.comhttps://www.fda.gov/media/152166/download  Fact Sheet for Healthcare Providers:  SeriousBroker.ithttps://www.fda.gov/media/152162/download  This test is no t yet approved or cleared by the [place] FDA and  has been authorized for detection and/or diagnosis of SARS-CoV-2 by FDA under an Emergency Use Authorization (EUA). This EUA will remain  in effect (meaning this test can be used) for the duration of the COVID-19 declaration under Section 564(b)(1) of the Act, 21 U.S.C.section 360bbb-3(b)(1), unless the authorization is terminated  or revoked sooner.       Influenza A by PCR NEGATIVE NEGATIVE   Influenza B by PCR NEGATIVE NEGATIVE    Comment: (NOTE) The Xpert Xpress SARS-CoV-2/FLU/RSV plus assay is intended as an aid in the diagnosis of influenza from Nasopharyngeal swab specimens and should not be used as a sole basis for treatment. Nasal washings and aspirates are unacceptable for Xpert Xpress SARS-CoV-2/FLU/RSV testing.  Fact Sheet for Patients: BloggerCourse.comhttps://www.fda.gov/media/152166/download  Fact Sheet for Healthcare Providers: SeriousBroker.ithttps://www.fda.gov/media/152162/download  This test is not yet approved or cleared by the [place] FDA and has been authorized for detection and/or diagnosis of SARS-CoV-2 by FDA under an Emergency Use Authorization (EUA). This EUA will remain in effect (meaning this test can be used) for the duration of the COVID-19 declaration under Section 564(b)(1) of the Act, 21 U.S.C. section  360bbb-3(b)(1), unless the authorization is terminated or revoked.  Performed at [facility], [address] [place], [place] [address]     Current Facility-Administered Medications  Medication Dose Route Frequency Provider Last Rate Last Admin   amLODipine (NORVASC) tablet 10 mg  10 mg Oral Daily Phineas SemenGoodman, Graydon, MD   10 mg at 03/06/22 2002   Glecaprevir-Pibrentasvir 100-40 MG TABS 3 tablet  3 tablet Oral Daily Phineas SemenGoodman, Graydon, MD       meclizine (ANTIVERT) tablet 25 mg  25 mg Oral TID PRN Phineas SemenGoodman, Graydon, MD       ondansetron (ZOFRAN-ODT) disintegrating tablet 4 mg  4 mg Oral Q8H PRN Phineas SemenGoodman, Graydon, MD       sertraline (ZOLOFT) tablet 50 mg  50 mg Oral Daily Phineas SemenGoodman, Graydon, MD   50 mg at 03/06/22 2002   traZODone (DESYREL) tablet 100 mg  100 mg Oral QHS Gillermo Murdochhompson, Filippo Puls, NP       Current Outpatient Medications  Medication Sig Dispense Refill   amLODipine (NORVASC) 10 MG tablet Take 10 mg by mouth daily.     Glecaprevir-Pibrentasvir 100-40 MG TABS Take 3 tablets by mouth daily.     sertraline (ZOLOFT) 50 MG tablet Take 50 mg by mouth daily.     meclizine (ANTIVERT) 25 MG tablet Take 1 tablet (25 mg total) by mouth 3 (three) times daily as needed for dizziness or nausea. 20 tablet 0   ondansetron (ZOFRAN-ODT) 4 MG disintegrating tablet Take 1 tablet (4 mg total) by mouth every 8 (eight) hours as needed for  nausea or vomiting. 20 tablet 0    Musculoskeletal: Strength & Muscle Tone: within normal limits Gait & Station: normal Patient leans: N/A Psychiatric Specialty Exam:  Presentation  General Appearance: Disheveled  Eye Contact:Good  Speech:Clear and Coherent  Speech Volume:Normal  Handedness:Right   Mood and Affect  Mood:No data recorded Affect:No data recorded  Thought Process  Thought Processes:Coherent  Descriptions of Associations:Loose  Orientation:Full (Time, Place and Person)  Thought Content:Logical  History of  Schizophrenia/Schizoaffective disorder:No data recorded Duration of Psychotic Symptoms:No data recorded Hallucinations:Hallucinations: None  Ideas of Reference:None  Suicidal Thoughts:Suicidal Thoughts: No  Homicidal Thoughts:Homicidal Thoughts: No   Sensorium  Memory:Immediate Fair; Recent Fair; Remote Fair  Judgment:Fair  Insight:Fair   Executive Functions  Concentration:Fair  Attention Span:Fair  Recall:Fair  Fund of Knowledge:Fair  Language:Fair   Psychomotor Activity  Psychomotor Activity:Psychomotor Activity: Normal   Assets  Assets:Communication Skills; Housing; Physical Health; Resilience   Sleep  Sleep:Sleep: Poor Number of Hours of Sleep: 2   Physical Exam: Physical Exam Vitals and nursing note reviewed.  Constitutional:      Appearance: Normal appearance. He is normal weight.  HENT:     Head: Normocephalic and atraumatic.     Right Ear: External ear normal.     Left Ear: External ear normal.     Nose: Nose normal.  Cardiovascular:     Rate and Rhythm: Normal rate.     Pulses: Normal pulses.  Pulmonary:     Effort: Pulmonary effort is normal.  Musculoskeletal:        General: Normal range of motion.  Neurological:     General: No focal deficit present.     Mental Status: He is alert and oriented to person, place, and time.  Psychiatric:        Attention and Perception: Attention and perception normal.        Mood and Affect: Mood is anxious and depressed. Affect is blunt.        Speech: Speech normal.        Behavior: Behavior is withdrawn. Behavior is cooperative.        Thought Content: Thought content normal.        Cognition and Memory: Cognition and memory normal.        Judgment: Judgment is impulsive and inappropriate.   Review of Systems  Psychiatric/Behavioral:  Positive for depression and substance abuse. The patient is nervous/anxious and has insomnia.   All other systems reviewed and are negative. Blood pressure (!)  169/122, pulse 67, temperature 97.6 F (36.4 C), resp. rate 18, height  (1.702 m), weight 81.6 kg, SpO2 99 %. Body mass index is 28.19 kg/m.  Treatment Plan Summary: Medication management and Plan The patient remained under observation overnight and will be reassessed in the a.m. to determine if he meets the criteria for psychiatric inpatient admission; he could be discharged home.  Disposition: Supportive therapy provided about ongoing stressors. The patient remained under observation overnight and will be reassessed in the a.m. to determine if he meets the criteria for psychiatric inpatient admission; he could be discharged home.  Gillermo Murdoch, NP 03/07/2022 12:25 AM

## 2022-03-07 NOTE — Discharge Instructions (Signed)
You have been seen in the emergency department for a  psychiatric concern. You have been evaluated both medically as well as psychiatrically. Please follow-up with your outpatient resources provided. Return to the emergency department for any worsening symptoms, or any thoughts of hurting yourself or anyone else so that we may attempt to help you. 

## 2022-03-07 NOTE — ED Provider Notes (Signed)
----------------------------------------- ?  8:59 AM on 03/07/2022 ?----------------------------------------- ?Patient has been seen and evaluated by psychiatry.  They believe the patient is safe for discharge home from psychiatric standpoint.  Patient's medical work-up shows cannabinoid and amphetamine positive urine toxicology otherwise reassuring work-up with negative COVID/flu.  Reassuring chemistry, negative ethanol acetaminophen and salicylate. ?  ?Harvest Dark, MD ?03/07/22 0900 ? ?

## 2022-03-07 NOTE — ED Notes (Signed)
Report received from Kim B, RN including SBAR. On initial round after report Pt is warm/dry, resting quietly in room without any s/s of distress.  Will continue to monitor throughout shift as ordered for any changes in behaviors and for continued safety.   ?

## 2022-03-07 NOTE — ED Notes (Signed)
During nursing assessment was A/Ox 4 .  .Phillip Mcdaniel  stated that he is not currently have thoughts or feelings of SI/HI.  Phillip Mcdaniel reported he is not auditory or visual hallucinations, pt did admit to using drugs and stated "at my age you'd think I'd know better"  Phillip. Phillip Mcdaniel remorse related to behaviors.  Pt affect is appropriate to situation , eye contact is intermittent , speech is clear with normal rate and volume  with appropriate verbiage noted.  Staff addressed any feelings or concerns that have been brought up.  Medications were administered as ordered. Continue to monitor patient as ordered for any changes in behaviors and for continued safety.   ?

## 2022-03-07 NOTE — ED Provider Notes (Signed)
Emergency Medicine Observation Re-evaluation Note ? ?Phillip Mcdaniel is a 51 y.o. male, seen on rounds today.  Pt initially presented to the ED for complaints of Psychiatric Evaluation ?Currently, the patient is resting without distress. ? ?Physical Exam  ?BP (!) 169/122 (BP Location: Left Arm)   Pulse 67   Temp 97.6 ?F (36.4 ?C)   Resp 18   Ht 5\' 7"  (1.702 m)   Wt 81.6 kg   SpO2 99%   BMI 28.19 kg/m?  ?Physical Exam ?General: alert, no distress, pleasant   ?Cardiac: well perfused, warm appearance ?Lungs: full, clear sentences ?Psych: calm ? ?ED Course / MDM  ?EKG:  ? ?I have reviewed the labs performed to date as well as medications administered while in observation.  Recent changes in the last 24 hours include . ? ?Plan  ?Current plan is for psychiatry reassessment this morning. ?SHAMIK CHOWDHRY is under involuntary commitment. ?  ? ?  Delman Kitten, MD ?03/07/22 0750 ? ?

## 2022-10-29 HISTORY — PX: NECK SURGERY: SHX720

## 2023-01-10 LAB — COLOGUARD

## 2023-04-15 IMAGING — MR MR HEAD W/O CM
12 series · 48 of 48 positions shown · non-contrast
Comparison: Head CT 10/18/2022.

CLINICAL DATA: 50-year-old male with sudden onset chest pain,
dizziness, altered mental status. Blurred vision.

EXAM:
MRI HEAD WITHOUT CONTRAST
TECHNIQUE: Multiplanar, multiecho pulse sequences of the brain and surrounding
structures were obtained without intravenous contrast.

[Series 5: ax dwi_tracew · axial · 3.0mm · 0.65mm/px · z∈[-90,+64]mm · 3 of 48 slices shown]
[im 1/48]
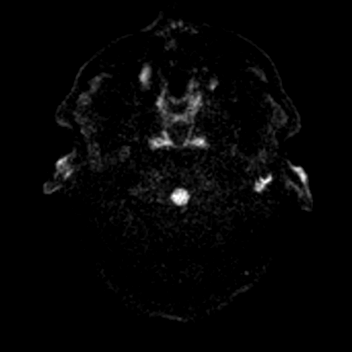
[im 24/48]
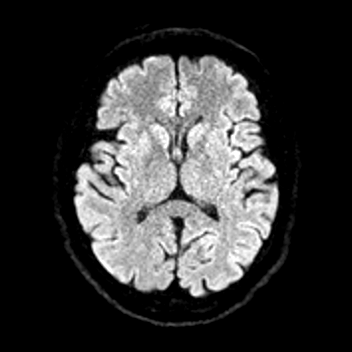
[im 48/48]
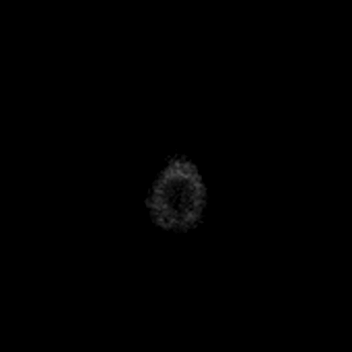

[Series 6: ax dwi_adc · axial · 3.0mm · 0.65mm/px · z∈[-90,+64]mm · 3 of 48 slices shown]
[im 1/48]
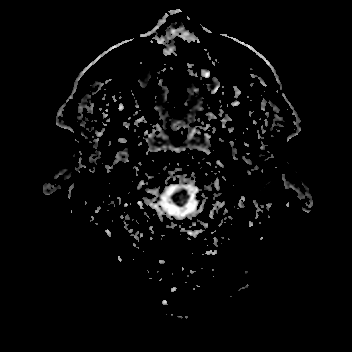
[im 24/48]
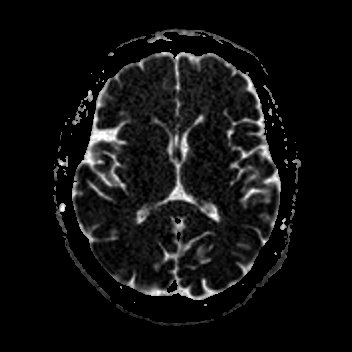
[im 48/48]
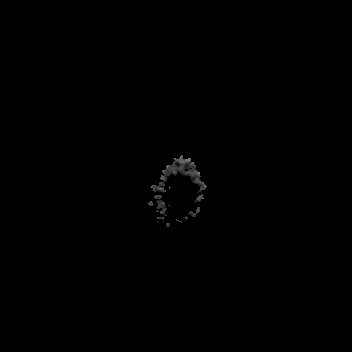

[Series 7: cor dwi_tracew · coronal · 5.0mm · 0.60mm/px · 3 of 34 slices shown]
[im 1/34]
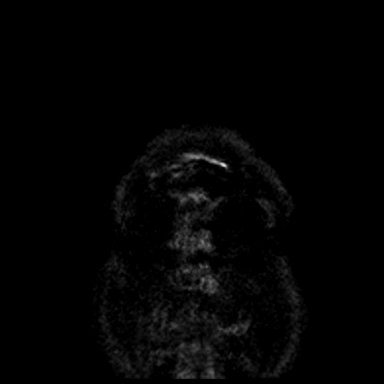
[im 17/34]
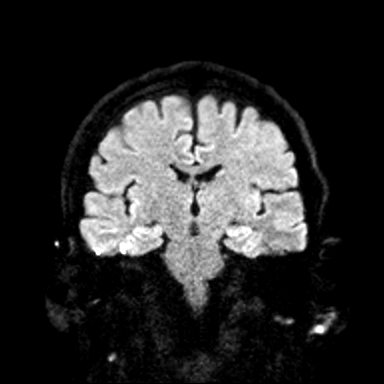
[im 34/34]
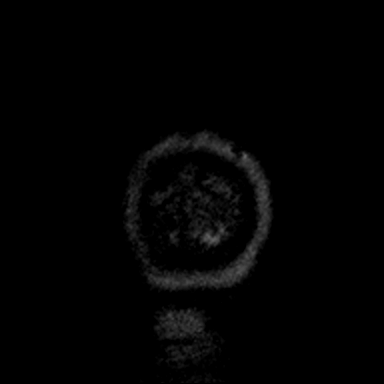

[Series 8: cor dwi_adc · coronal · 5.0mm · 0.60mm/px · 3 of 34 slices shown]
[im 1/34]
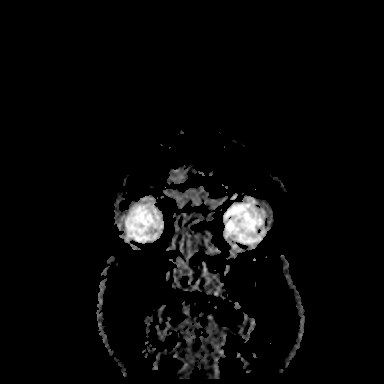
[im 17/34]
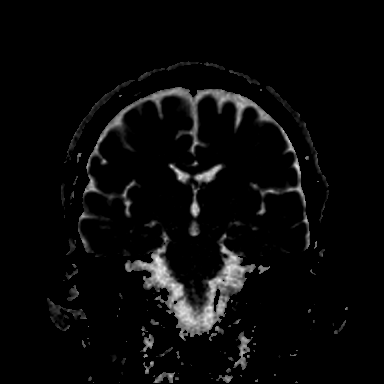
[im 34/34]
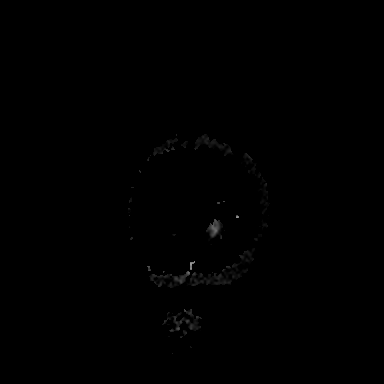

[Series 9: T1 · sagittal · 5.0mm · 0.62mm/px · 2 of 22 slices shown (1 of 2)]
[im 1/22]
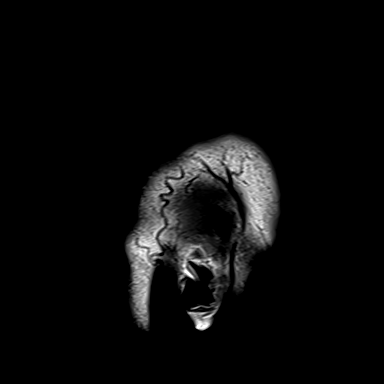
[im 22/22]
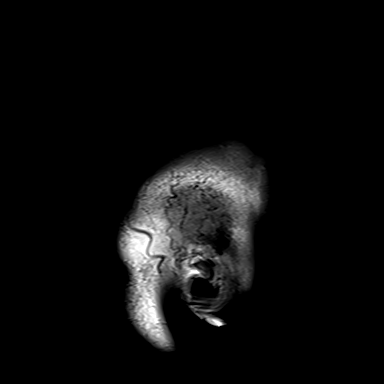

[Series 10: T2 · axial · 5.0mm · 0.53mm/px · z∈[-90,+59]mm · 2 of 26 slices shown (1 of 2)]
[im 1/26]
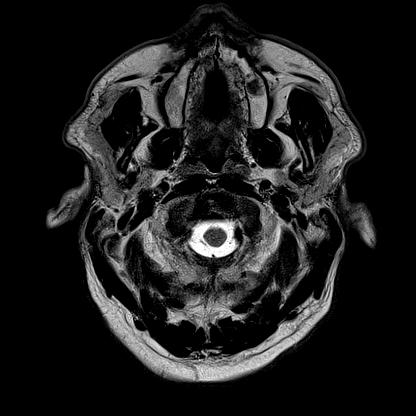
[im 26/26]
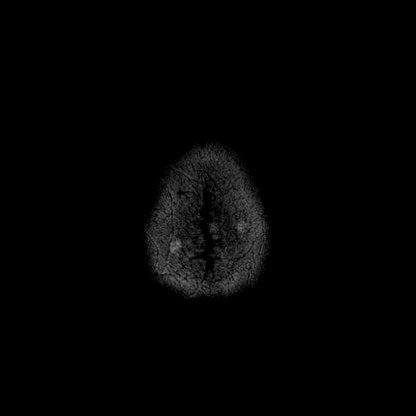

[Series 11: mag_images · axial · 3.0mm · 0.90mm/px · z∈[-104,+72]mm · 5 of 60 slices shown]
[im 1/60]
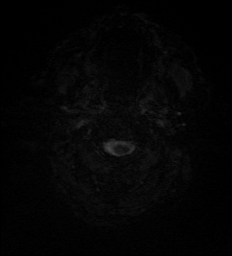
[im 15/60]
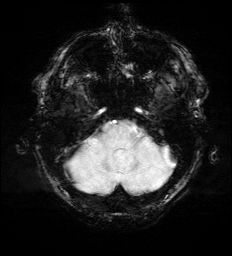
[im 30/60]
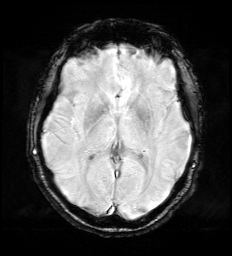
[im 45/60]
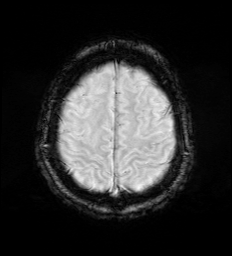
[im 60/60]
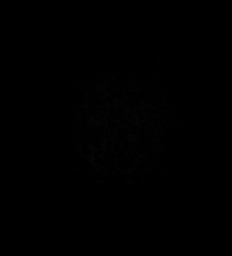

[Series 12: pha_images · axial · 3.0mm · 0.90mm/px · z∈[-104,+69]mm · 5 of 59 slices shown]
[im 1/59]
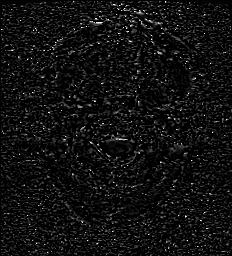
[im 15/59]
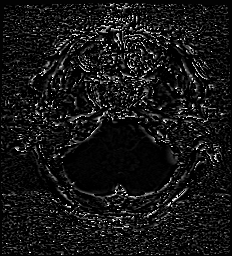
[im 30/59]
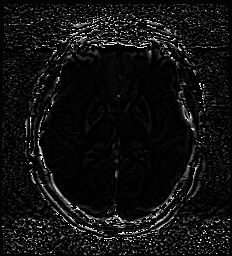
[im 44/59]
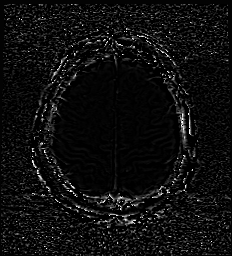
[im 59/59]
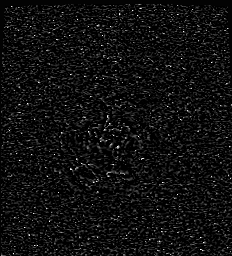

[Series 13: swi_images · axial · 3.0mm · 0.90mm/px · z∈[-104,+72]mm · 5 of 60 slices shown]
[im 1/60]
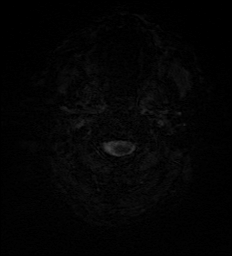
[im 15/60]
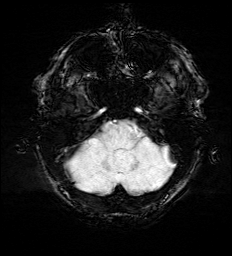
[im 30/60]
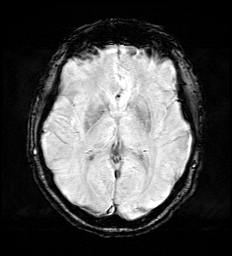
[im 45/60]
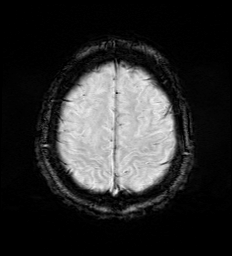
[im 60/60]
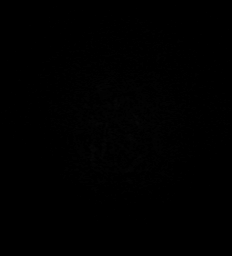

[Series 15: FLAIR · axial · 3.0mm · 0.53mm/px · z∈[-92,+62]mm · 3 of 44 slices shown]
[im 1/44]
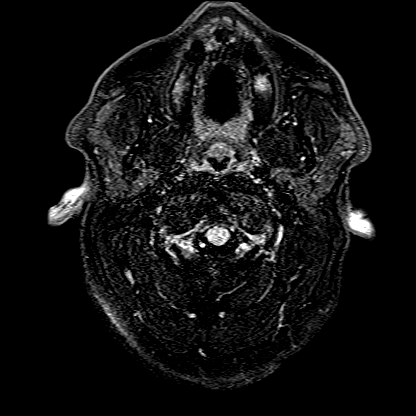
[im 22/44]
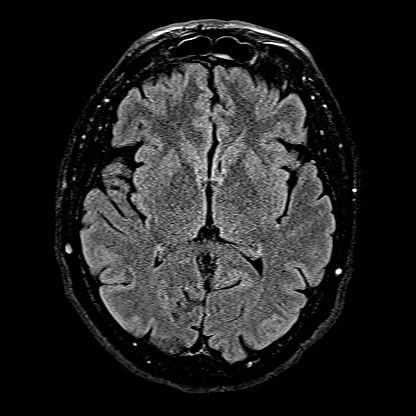
[im 44/44]
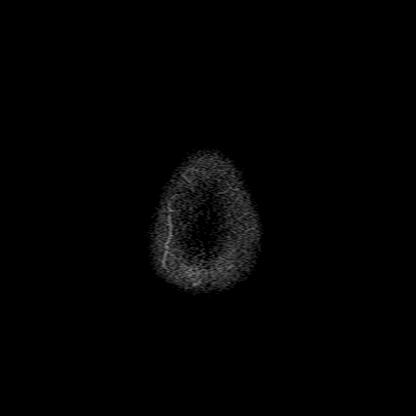

[Series 16: T1 · axial · 1.0mm · 0.98mm/px · z∈[-93,+65]mm · 12 of 160 slices shown (2 of 2)]
[im 1/160]
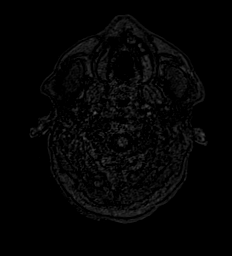
[im 15/160]
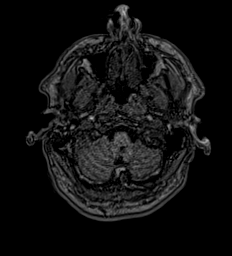
[im 29/160]
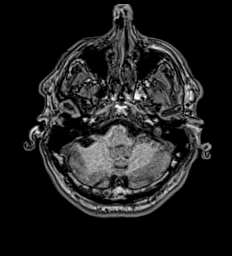
[im 44/160]
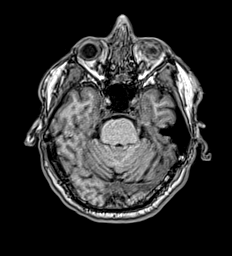
[im 58/160]
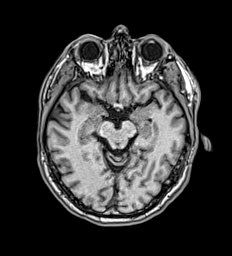
[im 73/160]
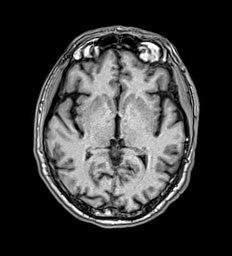
[im 87/160]
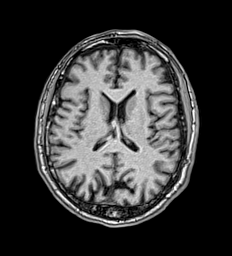
[im 102/160]
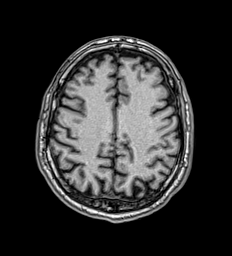
[im 116/160]
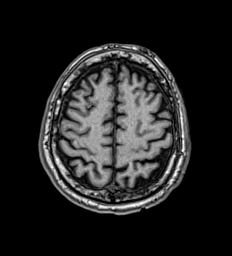
[im 131/160]
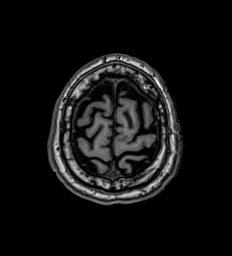
[im 145/160]
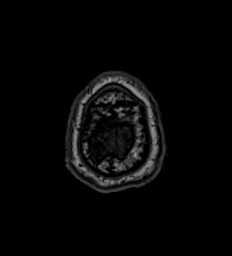
[im 160/160]
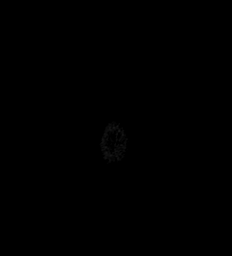

[Series 17: T2 · coronal · 5.0mm · 0.57mm/px · 2 of 26 slices shown (2 of 2)]
[im 1/26]
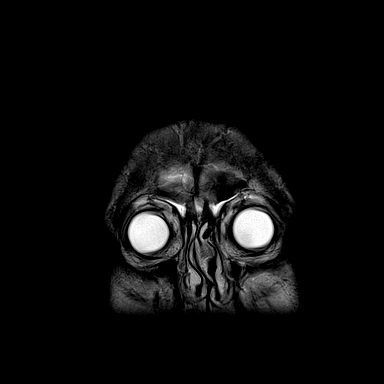
[im 26/26]
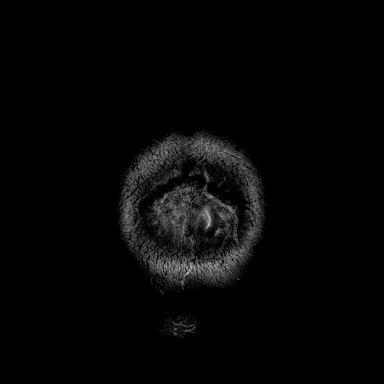

[48 of 48 positions shown; findings below may reference images not displayed]

FINDINGS: Brain: No restricted diffusion to suggest acute infarction. No
midline shift, mass effect, evidence of mass lesion,
ventriculomegaly, extra-axial collection or acute intracranial
hemorrhage. Cervicomedullary junction and pituitary are within
normal limits.

Gray and white matter signal is within normal limits throughout the
brain. No encephalomalacia or chronic cerebral blood products
identified.

Vascular: Major intracranial vascular flow voids are preserved. The
left vertebral artery appears dominant.

Skull and upper cervical spine: Negative. Visualized bone marrow
signal is within normal limits.

Sinuses/Orbits: Orbits appear symmetric and normal. Mild paranasal
sinus mucosal thickening, primarily in the frontoethmoidal recesses.
No sinus fluid level.

Other: Mastoids are clear. Visible internal auditory structures
appear normal. Negative visible scalp and face.
IMPRESSION: 1. Normal noncontrast MRI appearance of the brain.
2. Minor paranasal sinus inflammation, significance doubtful.

## 2024-01-23 ENCOUNTER — Other Ambulatory Visit: Payer: Self-pay | Admitting: Student

## 2024-01-23 DIAGNOSIS — M5412 Radiculopathy, cervical region: Secondary | ICD-10-CM

## 2024-01-28 ENCOUNTER — Other Ambulatory Visit: Payer: Self-pay | Admitting: Student

## 2024-01-28 ENCOUNTER — Encounter: Payer: Self-pay | Admitting: Student

## 2024-01-28 DIAGNOSIS — M5412 Radiculopathy, cervical region: Secondary | ICD-10-CM

## 2024-01-28 DIAGNOSIS — T1590XA Foreign body on external eye, part unspecified, unspecified eye, initial encounter: Secondary | ICD-10-CM

## 2024-02-06 ENCOUNTER — Ambulatory Visit
Admission: RE | Admit: 2024-02-06 | Discharge: 2024-02-06 | Disposition: A | Discharge status: Home / Self Care | Source: Ambulatory Visit | Attending: Student | Admitting: Student

## 2024-02-06 DIAGNOSIS — T1590XA Foreign body on external eye, part unspecified, unspecified eye, initial encounter: Secondary | ICD-10-CM

## 2024-02-06 DIAGNOSIS — M5412 Radiculopathy, cervical region: Secondary | ICD-10-CM

## 2024-02-26 ENCOUNTER — Other Ambulatory Visit: Payer: Self-pay | Admitting: Neurosurgery

## 2024-03-03 NOTE — Progress Notes (Signed)
 Surgical Instructions   Your procedure is scheduled on Monday, May 19th, 2025. Report to [facility] Main Entrance "A" at 10:00 A.M., then check in with the Admitting office. Any questions or running late day of surgery: call [phone]  Questions prior to your surgery date: call [phone], Monday-Friday, 8am-4pm. If you experience any cold or flu symptoms such as cough, fever, chills, shortness of breath, etc. between now and your scheduled surgery, please notify us  at the above number.     Remember:  Do not eat or drink after midnight the night before your surgery     Take these medicines the morning of surgery with A SIP OF WATER: None   May take these medicines IF NEEDED: Acetaminophen  (Tylenol )    One week prior to surgery, STOP taking any Aspirin (unless otherwise instructed by your surgeon) Aleve, Naproxen, Ibuprofen , Motrin , Advil , Goody's, BC's, all herbal medications, fish oil, and non-prescription vitamins.                     Do NOT Smoke (Tobacco/Vaping) for 24 hours prior to your procedure.  If you use a CPAP at night, you may bring your mask/headgear for your overnight stay.   You will be asked to remove any contacts, glasses, piercing's, hearing aid's, dentures/partials prior to surgery. Please bring cases for these items if needed.    Patients discharged the day of surgery will not be allowed to drive home, and someone needs to stay with them for 24 hours.  SURGICAL WAITING ROOM VISITATION Patients may have no more than 2 support people in the waiting area - these visitors may rotate.   Pre-op nurse will coordinate an appropriate time for 1 ADULT support person, who may not rotate, to accompany patient in pre-op.  Children under the age of 41 must have an adult with them who is not the patient and must remain in the main waiting area with an adult.  If the patient needs to stay at the hospital during part of their recovery, the visitor guidelines for  inpatient rooms apply.  Please refer to the [facility] website for the visitor guidelines for any additional information.   If you received a COVID test during your pre-op visit  it is requested that you wear a mask when out in public, stay away from anyone that may not be feeling well and notify your surgeon if you develop symptoms. If you have been in contact with anyone that has tested positive in the last 10 days please notify you surgeon.      Pre-operative 5 CHG Bathing Instructions   You can play a key role in reducing the risk of infection after surgery. Your skin needs to be as free of germs as possible. You can reduce the number of germs on your skin by washing with CHG (chlorhexidine gluconate) soap before surgery. CHG is an antiseptic soap that kills germs and continues to kill germs even after washing.   DO NOT use if you have an allergy to chlorhexidine/CHG or antibacterial soaps. If your skin becomes reddened or irritated, stop using the CHG and notify one of our RNs at [phone].   Please shower with the CHG soap starting 4 days before surgery using the following schedule:     Please keep in mind the following:  DO NOT shave, including legs and underarms, starting the day of your first shower.   You may shave your face at any point before/day of surgery.  Place  clean sheets on your bed the day you start using CHG soap. Use a clean washcloth (not used since being washed) for each shower. DO NOT sleep with pets once you start using the CHG.   CHG Shower Instructions:  Wash your face and private area with normal soap. If you choose to wash your hair, wash first with your normal shampoo.  After you use shampoo/soap, rinse your hair and body thoroughly to remove shampoo/soap residue.  Turn the water OFF and apply about 3 tablespoons (45 ml) of CHG soap to a CLEAN washcloth.  Apply CHG soap ONLY FROM YOUR NECK DOWN TO YOUR TOES (washing for 3-5 minutes)  DO NOT use CHG  soap on face, private areas, open wounds, or sores.  Pay special attention to the area where your surgery is being performed.  If you are having back surgery, having someone wash your back for you may be helpful. Wait 2 minutes after CHG soap is applied, then you may rinse off the CHG soap.  Pat dry with a clean towel  Put on clean clothes/pajamas   If you choose to wear lotion, please use ONLY the CHG-compatible lotions that are listed below.  Additional instructions for the day of surgery: DO NOT APPLY any lotions, deodorants, cologne, or perfumes.   Do not bring valuables to the hospital. [facility] is not responsible for any belongings/valuables. Do not wear nail polish, gel polish, artificial nails, or any other type of covering on natural nails (fingers and toes) Do not wear jewelry or makeup Put on clean/comfortable clothes.  Please brush your teeth.  Ask your nurse before applying any prescription medications to the skin.     CHG Compatible Lotions   Aveeno Moisturizing lotion  Cetaphil Moisturizing Cream  Cetaphil Moisturizing Lotion  Clairol Herbal Essence Moisturizing Lotion, Dry Skin  Clairol Herbal Essence Moisturizing Lotion, Extra Dry Skin  Clairol Herbal Essence Moisturizing Lotion, Normal Skin  Curel Age Defying Therapeutic Moisturizing Lotion with Alpha Hydroxy  Curel Extreme Care Body Lotion  Curel Soothing Hands Moisturizing Hand Lotion  Curel Therapeutic Moisturizing Cream, Fragrance-Free  Curel Therapeutic Moisturizing Lotion, Fragrance-Free  Curel Therapeutic Moisturizing Lotion, Original Formula  Eucerin Daily Replenishing Lotion  Eucerin Dry Skin Therapy Plus Alpha Hydroxy Crme  Eucerin Dry Skin Therapy Plus Alpha Hydroxy Lotion  Eucerin Original Crme  Eucerin Original Lotion  Eucerin Plus Crme Eucerin Plus Lotion  Eucerin TriLipid Replenishing Lotion  Keri Anti-Bacterial Hand Lotion  Keri Deep Conditioning Original Lotion Dry Skin Formula Softly  Scented  Keri Deep Conditioning Original Lotion, Fragrance Free Sensitive Skin Formula  Keri Lotion Fast Absorbing Fragrance Free Sensitive Skin Formula  Keri Lotion Fast Absorbing Softly Scented Dry Skin Formula  Keri Original Lotion  Keri Skin Renewal Lotion Keri Silky Smooth Lotion  Keri Silky Smooth Sensitive Skin Lotion  Nivea Body Creamy Conditioning Oil  Nivea Body Extra Enriched Lotion  Nivea Body Original Lotion  Nivea Body Sheer Moisturizing Lotion Nivea Crme  Nivea Skin Firming Lotion  NutraDerm 30 Skin Lotion  NutraDerm Skin Lotion  NutraDerm Therapeutic Skin Cream  NutraDerm Therapeutic Skin Lotion  ProShield Protective Hand Cream  Provon moisturizing lotion  Please read over the following fact sheets that you were given.

## 2024-03-04 ENCOUNTER — Other Ambulatory Visit: Payer: Self-pay

## 2024-03-04 ENCOUNTER — Encounter (HOSPITAL_COMMUNITY): Payer: Self-pay

## 2024-03-04 ENCOUNTER — Encounter (HOSPITAL_COMMUNITY)
Admission: RE | Admit: 2024-03-04 | Discharge: 2024-03-04 | Disposition: A | Discharge status: Home / Self Care | Source: Ambulatory Visit | Attending: Neurosurgery | Admitting: Neurosurgery

## 2024-03-04 VITALS — BP 210/108 | HR 57 | Temp 97.8°F | Resp 18 | Ht 67.0 in | Wt 191.2 lb

## 2024-03-04 DIAGNOSIS — M5416 Radiculopathy, lumbar region: Secondary | ICD-10-CM | POA: Insufficient documentation

## 2024-03-04 DIAGNOSIS — Z01818 Encounter for other preprocedural examination: Secondary | ICD-10-CM | POA: Insufficient documentation

## 2024-03-04 DIAGNOSIS — B1920 Unspecified viral hepatitis C without hepatic coma: Secondary | ICD-10-CM | POA: Insufficient documentation

## 2024-03-04 DIAGNOSIS — R9431 Abnormal electrocardiogram [ECG] [EKG]: Secondary | ICD-10-CM | POA: Insufficient documentation

## 2024-03-04 DIAGNOSIS — I10 Essential (primary) hypertension: Secondary | ICD-10-CM | POA: Insufficient documentation

## 2024-03-04 DIAGNOSIS — Z96641 Presence of right artificial hip joint: Secondary | ICD-10-CM | POA: Diagnosis not present

## 2024-03-04 DIAGNOSIS — F1991 Other psychoactive substance use, unspecified, in remission: Secondary | ICD-10-CM | POA: Insufficient documentation

## 2024-03-04 DIAGNOSIS — Z01812 Encounter for preprocedural laboratory examination: Secondary | ICD-10-CM | POA: Diagnosis present

## 2024-03-04 DIAGNOSIS — Z01810 Encounter for preprocedural cardiovascular examination: Secondary | ICD-10-CM | POA: Diagnosis present

## 2024-03-04 HISTORY — DX: Inflammatory liver disease, unspecified: K75.9

## 2024-03-04 HISTORY — DX: Unspecified osteoarthritis, unspecified site: M19.90

## 2024-03-04 LAB — BASIC METABOLIC PANEL WITH GFR
Anion gap: 12 (ref 5–15)
BUN: 11 mg/dL (ref 6–20)
CO2: 25 mmol/L (ref 22–32)
Calcium: 10 mg/dL (ref 8.9–10.3)
Chloride: 106 mmol/L (ref 98–111)
Creatinine, Ser: 1.04 mg/dL (ref 0.61–1.24)
GFR, Estimated: >60 mL/min (ref >60)
Glucose, Bld: 89 mg/dL (ref 70–99)
Potassium: 4.3 mmol/L (ref 3.5–5.1)
Sodium: 143 mmol/L (ref 135–145)

## 2024-03-04 LAB — SURGICAL PCR SCREEN
MRSA, PCR: NEGATIVE
Staphylococcus aureus: NEGATIVE

## 2024-03-04 LAB — HEPATIC FUNCTION PANEL
ALT: 23 U/L (ref 0–44)
AST: 22 U/L (ref 15–41)
Albumin: 4 g/dL (ref 3.5–5.0)
Alkaline Phosphatase: 67 U/L (ref 38–126)
Bilirubin, Direct: <0.1 mg/dL (ref 0.0–0.2)
Total Bilirubin: 1.1 mg/dL (ref 0.0–1.2)
Total Protein: 7.9 g/dL (ref 6.5–8.1)

## 2024-03-04 LAB — CBC
HCT: 49.3 % (ref 39.0–52.0)
Hemoglobin: 17 g/dL (ref 13.0–17.0)
MCH: 32.1 pg (ref 26.0–34.0)
MCHC: 34.5 g/dL (ref 30.0–36.0)
MCV: 93.2 fL (ref 80.0–100.0)
Platelets: 188 10*3/uL (ref 150–400)
RBC: 5.29 MIL/uL (ref 4.22–5.81)
RDW: 13.3 % (ref 11.5–15.5)
WBC: 8.5 10*3/uL (ref 4.0–10.5)
nRBC: 0 % (ref 0.0–0.2)

## 2024-03-04 LAB — TYPE AND SCREEN
ABO/RH(D): A NEG
Antibody Screen: NEGATIVE

## 2024-03-04 NOTE — Progress Notes (Addendum)
 Anesthesia APP PAT Evaluation:  Case: [id number] Date/Time: 03/16/24 1145   Procedure: POSTERIOR LUMBAR FUSION 1 LEVEL (Back) - PLIF L5-S1   Anesthesia type: General   Diagnosis: Radiculopathy, lumbar region [M54.16]   Pre-op diagnosis: RADICULOPATHY, LUMBAR REGION   [facility] ROOM 21 / [facility] OR   Surgeons: Agustina Aldrich, MD       DISCUSSION: Patient is a 53 year old male scheduled for the above procedure.  He was evalauted during 03/04/24 PAT visit.  History includes never smoker, HTN, Hepatitis C, AVN (s/p right THA ~ 2015). Prior polysubstance use (cocaine, amphetamines, cannabis), but denied current use. He drinks ~1 beer/day.    He is currently living in a tent on the same property of his ex-wife. He relocated back to [facility] [place] from [place], [place] after [facility]. He is in the process of applying for Disability. He reports chronic painful paresthesias in this hands with decreased grips. He also has back pain and leg pain that has been progressive over the past two years.  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 He is s/p C5-7 ACDF 04/30/23.  I evaluated him during his PAT visit due to elevated BP on 210/108, recheck 199/111. Readings were reportedly consistent in both arms. Manual BP RUE was 194/114. He denied chest pain, SOB, headaches. He reports long time history of HTN (previously on amlodipine ), but says last year his BP was fine at follow-up with this PCP in [place].  In review of  BP trends at [facility] 150-180/90-112 from January - March 2025.     We attempted to contact a few providers in [facility] to get him established with PCP asap for HTN management; however, we had to leave voice messages. He will follow-up, and says he has access to a automated home BP monitor as well. We discussed potential complications for untreated HTN, as well as potential for case  cancellation if he arrives with significant BP elevation. I gave him my office number to update me, and I will update staff at Dr. Lawyer Pride office.    ADDENDUM 03/12/24 6:00 PM: I called and spoke with Phillip Mcdaniel. He was able to get established with PCP Altamese Jest, MD, seen on 03/11/24. BP 160/110. He was prescribed amlodipine  5 mg daily and lisinopril 5 mg daily. He is planning to pick up prescriptions this evening and start by tomorrow morning. He was instructed to hold lisinopril on the morning of surgery. If he takes amlodipine  on a morning schedule, then he was told he should take on the day of surgery with sips of water.     VS: BP (!) 210/108   Pulse (!) 57   Temp 36.6 C   Resp 18   Ht 5\' 7"  (1.702 m)   Wt 86.7 kg   SpO2 99%   BMI 29.95 kg/m  BP 199/111,Pulse 55. Manual RUE BP 194/114 Heart RRR, no murmur. Lungs clear. No carotid bruits noted. No pretibial edema noted.   PROVIDERSAltamese Jest, MD is PCP, established 03/11/24   LABS: Labs reviewed: Acceptable for surgery. (all labs ordered are listed, but only abnormal results are displayed)  Labs Reviewed  SURGICAL PCR SCREEN  BASIC METABOLIC PANEL WITH GFR  CBC  HEPATIC FUNCTION PANEL  TYPE AND SCREEN     IMAGES: MRI C-spine 02/06/24: IMPRESSION: 1. Interval C5-C7 ACDF without residual spinal stenosis. Moderate to severe  bilateral neural foraminal stenosis at both levels. 2. Mild spinal stenosis and moderate to severe right neural foraminal stenosis at C4-5, mildly progressed. 3. New mild spinal stenosis and unchanged moderate to severe bilateral neural foraminal stenosis at C3-4.   MRI L-spine 06/30/23: Images in PACS/[facility].   EKG: 03/04/24: Sinus bradycardia at 53 bpm Septal infarct , age undetermined Abnormal ECG When compared with ECG of 18-Nov-2021 21:56, - Septal infarct also noted on 11/18/21 tracing.    CV: Nuclear stress test 12/19/11 ([facility]) IMPRESSION: Negative scan.    Past Medical  History:  Diagnosis Date   Arthritis    AVN (avascular necrosis of bone) (HCC)    RIGHT hip   Hypertension     Past Surgical History:  Procedure Laterality Date   BACK SURGERY     anterior cervical   HIP SURGERY     RIGHT hip replacement    MEDICATIONS:  acetaminophen  (TYLENOL ) 500 MG tablet   No current facility-administered medications for this encounter.    Ella Gun, PA-C Surgical Short Stay/Anesthesiology [facility] Phone [phone] [facility] Phone [phone] 03/05/2024 9:13 AM

## 2024-03-04 NOTE — Progress Notes (Incomplete)
 Anesthesia APP PAT Evaluation:  Case: [id number] Date/Time: 03/16/24 1145   Procedure: POSTERIOR LUMBAR FUSION 1 LEVEL (Back) - PLIF L5-S1   Anesthesia type: General   Diagnosis: Radiculopathy, lumbar region [M54.16]   Pre-op diagnosis: RADICULOPATHY, LUMBAR REGION   [facility] / [facility]   Surgeons: Agustina Aldrich, MD       DISCUSSION: Patient is a 53 year old male scheduled for the above procedure.   History includes never smoker, HTN, Hepatitis C, AVN (s/p right THA ~ 2015). Prior polysubstance use (cocaine, amphetamines, cannabis), but denied current use. He drinks ~1 beer/day.    He is currently living in a tent b ehind his ex-wife's house. He was living in [place], [place] but relocated after [facility]   He is being evaluated for disability. He has chronic decreased strength with N/T in his hands. He is s/p C5-7 ACDF 05/20/23. He has back pain and leg weakness as well. He had previously been working on [employer], building bridges.   He denied chest pain, SOB, edema, syncope, palpitations.   VS: BP (!) 210/108   Pulse (!) 57   Temp 36.6 C   Resp 18   Ht 5\' 7"  (1.702 m)   Wt 86.7 kg   SpO2 99%   BMI 29.95 kg/m  BP 199/111,Pulse 55   Apr-24-2025 4:02 PM 67.00 in 86.183 kg (190.00 lbs) 29.76 kg/meter(2) 70 /min 159/100 mm[Hg]                    Mar-20-2025 4:09 PM 67.00 in 74.843 kg (165.00 lbs) 25.84 kg/meter(2) 58 /min 151/100 mm[Hg]                    Mar-01-2024 4:32 PM       60 /min 150/98 mm[Hg]                    Mar-01-2024 4:56 PM       56 /min 173/100 mm[Hg]                    Mar-01-2024 3:49 PM 67.00 in 74.843 kg (165.00 lbs) 25.84 kg/meter(2) 62 /min 150/90 mm[Hg]                    Feb-08-2024 2:19 PM 67.00 in 74.843 kg (165.00 lbs) 25.84 kg/meter(2) 56 /min 180/113 mm[Hg]                    Jan-06-2024 3:37 PM       72 /min 159/108 mm[Hg]                    Jan-06-2024 4:12 PM       81 /min 172/112 mm[Hg]             Manual RUE BP  194/114  Heart RRR, no murmur. Lungs clear. No carotid bruits noted. No pretibial edema noted.  PROVIDERS: [facility]   LABS: Labs reviewed: Acceptable for surgery. (all labs ordered are listed, but only abnormal results are displayed)  Labs Reviewed  SURGICAL PCR SCREEN  BASIC METABOLIC PANEL WITH GFR  CBC  HEPATIC FUNCTION PANEL  TYPE AND SCREEN     IMAGES: MRI C-spine 02/06/24: IMPRESSION: 1. Interval C5-C7 ACDF without residual spinal stenosis. Moderate to severe bilateral neural foraminal stenosis at both levels. 2. Mild spinal stenosis and moderate to severe right neural foraminal stenosis at C4-5, mildly progressed. 3. New  mild spinal stenosis and unchanged moderate to severe bilateral neural foraminal stenosis at C3-4.   MRI L-spine 06/30/23: Images in PACS/[facility].  EKG: 03/04/24: Sinus bradycardia at 53 bpm Septal infarct , age undetermined Abnormal ECG When compared with ECG of 18-Nov-2021 21:56, - Septal infarct also noted on 11/18/21 tracing.    CV: Nuclear stress test 12/19/11 ([facility]) IMPRESSION: Negative scan.    Past Medical History:  Diagnosis Date  . Arthritis   . AVN (avascular necrosis of bone) (HCC)    RIGHT hip  . Hypertension     Past Surgical History:  Procedure Laterality Date  . BACK SURGERY     anterior cervical  . HIP SURGERY     RIGHT hip replacement    MEDICATIONS: . acetaminophen  (TYLENOL ) 500 MG tablet   No current facility-administered medications for this encounter.

## 2024-03-04 NOTE — Progress Notes (Signed)
 PCP - none Cardiologist - denies  PPM/ICD - denies Device Orders -  Rep Notified -   Chest x-ray - na EKG - 03/04/24 Stress Test - years ago but doesn't remember where ECHO -  Cardiac Cath -   Sleep Study - denies CPAP -   Fasting Blood Sugar - na Checks Blood Sugar _____ times a day  Last dose of GLP1 agonist- na  GLP1 instructions:   Blood Thinner Instructions:na Aspirin Instructions:na  ERAS Protcol -no PRE-SURGERY Ensure or G2-   COVID TEST- na   Anesthesia review: yes-elevated BP;no PCP  Patient denies shortness of breath, fever, cough and chest pain at PAT appointment   All instructions explained to the patient, with a verbal understanding of the material. Patient agrees to go over the instructions while at home for a better understanding. Patient also instructed to self quarantine after being tested for COVID-19. The opportunity to ask questions was provided.

## 2024-03-05 ENCOUNTER — Ambulatory Visit: Payer: Self-pay

## 2024-03-05 NOTE — Telephone Encounter (Signed)
 Copied from CRM [id number]. Topic: Clinical - Red Word Triage >> Mar 05, 2024 11:44 AM DeAngela L wrote: Red Word that prompted transfer to Nurse Triage: Pt states his blood pressure was 180 and almost 200 on both arms and it was really high when he was in a visit for post op surgery he went to [facility] yesterday and they want him to call his provider  Pt was trying to establish new Pt care with [facility] >> Mar 05, 2024 11:48 AM DeAngela L wrote: Patient not taking the pain meds he doesn't want to rely on taking meds and he has a slight head ache now    Chief Complaint: Blood Pressure-High Symptoms: Headaches, Blurred Vision,  Frequency: Acute  Pertinent Negatives: Patient denies chest pain, dyspnea  Disposition: [x] ED /[] Urgent Care (no appt availability in office) / [] Appointment(In office/virtual)/ []  [facility] [facility]/ [] Home Care/ [] Refused Recommended Disposition /[] [facility]/ []  Follow-up with PCP Additional Notes: PW is being triaged for an elevated blood pressure. The patient is supposed to have back surgery on the 19th.   History of Degenerative Discs, OA,   Before the recommendation could be given, the patient stated he needed to get off the phone to arrange transportation, and that he would call back.   Connected to the patient again, recommended ED for prompt evaluation and treatment BEFORE New patient visit. Verbalized understanding, agreed to disposition.    Reason for Disposition  [1] Systolic BP  >= 160 OR Diastolic >= 100 AND [2] cardiac (e.g., breathing difficulty, chest pain) or neurologic symptoms (e.g., new-onset blurred or double vision, unsteady gait)  Answer Assessment - Initial Assessment Questions 1. BLOOD PRESSURE: "What is the blood pressure?" "Did you take at least two measurements 5 minutes apart?"     200s systolic, 100s Diastolic  2. ONSET: "When did you take your blood pressure?"     Yesterday  3. HOW: "How did you take your blood  pressure?" (e.g., automatic home BP monitor, visiting nurse)      At appointment yesterday, has already been to the ED  4. HISTORY: "Do you have a history of high blood pressure?"     Yes, states "I have them all the time."  5. MEDICINES: "Are you taking any medicines for blood pressure?" "Have you missed any doses recently?"     10 years ago.  6. OTHER SYMPTOMS: "Do you have any symptoms?" (e.g., blurred vision, chest pain, difficulty breathing, headache, weakness)     Blurred vision, headaches, numbness and tingling in both hands and feet.  Protocols used: Blood Pressure - High-A-AH

## 2024-03-05 NOTE — Telephone Encounter (Signed)
 Please review, patient is scheduled to see you 03/11/24.

## 2024-03-05 NOTE — Telephone Encounter (Signed)
 Agree with ED evaluation.

## 2024-03-06 ENCOUNTER — Ambulatory Visit: Admitting: Family Medicine

## 2024-03-11 ENCOUNTER — Ambulatory Visit: Admitting: Family Medicine

## 2024-03-11 ENCOUNTER — Encounter: Payer: Self-pay | Admitting: Family Medicine

## 2024-03-11 VITALS — BP 160/110 | HR 74 | Temp 97.4°F | Ht 67.0 in | Wt 189.0 lb

## 2024-03-11 DIAGNOSIS — B182 Chronic viral hepatitis C: Secondary | ICD-10-CM

## 2024-03-11 DIAGNOSIS — I10 Essential (primary) hypertension: Secondary | ICD-10-CM

## 2024-03-11 DIAGNOSIS — M47816 Spondylosis without myelopathy or radiculopathy, lumbar region: Secondary | ICD-10-CM

## 2024-03-11 DIAGNOSIS — Z981 Arthrodesis status: Secondary | ICD-10-CM | POA: Insufficient documentation

## 2024-03-11 DIAGNOSIS — F1910 Other psychoactive substance abuse, uncomplicated: Secondary | ICD-10-CM

## 2024-03-11 DIAGNOSIS — G5600 Carpal tunnel syndrome, unspecified upper limb: Secondary | ICD-10-CM | POA: Insufficient documentation

## 2024-03-11 MED ORDER — AMLODIPINE BESYLATE 5 MG PO TABS: 5.0000 mg | ORAL_TABLET | Freq: Every day | ORAL | 0 refills | Status: DC

## 2024-03-11 MED ORDER — LISINOPRIL 5 MG PO TABS: 5.0000 mg | ORAL_TABLET | Freq: Every day | ORAL | 0 refills | Status: DC

## 2024-03-11 NOTE — Patient Instructions (Signed)
 Please call to make an appointment.   Recommend seeing you in 4 weeks.  Start checking blood pressure daily.  BP 140/90.  Pick up your 2 new prescription at pharmacy.

## 2024-03-11 NOTE — Progress Notes (Signed)
 New Patient Office Visit  Subjective    Patient ID: Phillip Mcdaniel, male    DOB: [birth year]  Age: 53 y.o. MRN: [id number]  CC:  Chief Complaint  Patient presents with   Establish Care   Hypertension    Has headaches daily, feels like it may be due to neck pain, pt has a upcoming surgery for back pain, pt done pre op appt. He had high BP    HPI Phillip Mcdaniel presents to establish care  Concerns:   Elevated blood pressure.  Reports he recently got medicaid. He did not have a PCP for a long time. States he is having back surgery next week. He was told his blood pressure was high. He is aware that he has high blood pressure but states he never took medication. Currently he is asymptomatic.   He is home less, lost his place  due to [place]. He is planning on [place] in [place]. States he has transportation issue.  History of Hep C States he was treated for it. Reports it was Mayvret.  Hx of polysubstance use. Sober currently.  Hx of cervical spinal fusion in February 25.  chronic back pain, scheduled for surgery per pt report.  He is following Hotel manager in [place].   Outpatient Encounter Medications as of 03/11/2024  Medication Sig   acetaminophen  (TYLENOL ) 500 MG tablet Take 500-1,000 mg by mouth every 6 (six) hours as needed (pain.).   amLODipine  (NORVASC ) 5 MG tablet Take 1 tablet (5 mg total) by mouth daily.   lisinopril (ZESTRIL) 5 MG tablet Take 1 tablet (5 mg total) by mouth daily.   [DISCONTINUED] CYMBALTA 20 MG capsule Take 1 capsule by mouth.   hydrOXYzine  (ATARAX ) 25 MG tablet Take 25 mg by mouth every 6 (six) hours. (Patient not taking: Reported on 03/11/2024)   pregabalin (LYRICA) 100 MG capsule Take 100 mg by mouth 3 (three) times daily. (Patient not taking: Reported on 03/11/2024)   [DISCONTINUED] ipratropium (ATROVENT ) 0.06 % nasal spray Place 2 sprays into both nostrils 4 (four) times daily as needed for rhinitis.   No facility-administered  encounter medications on file as of 03/11/2024.    Past Medical History:  Diagnosis Date   Anxiety    Arthritis    AVN (avascular necrosis of bone) (HCC)    RIGHT hip   Chronic neck pain    Depression    ED (erectile dysfunction)    Hepatitis    Hepatitis C   Hypertension    Lumbar spondylolysis     Past Surgical History:  Procedure Laterality Date   BACK SURGERY     anterior cervical   HIP SURGERY     RIGHT hip replacement   NECK SURGERY  2024   C3-6    Family History  Problem Relation Age of Onset   Hypertension Mother    Hypertension Father    Heart disease Father    Hypertension Sister    Depression Sister    Hypertension Maternal Grandmother    Hypertension Maternal Grandfather    Hypertension Paternal Grandmother    Hypertension Paternal Grandfather     Social History   Socioeconomic History   Marital status: Divorced    Spouse name: Not on file   Number of children: 1   Years of education: Not on file   Highest education level: Not on file  Occupational History   Not on file  Tobacco Use   Smoking status: Never   Smokeless tobacco:  Never  Vaping Use   Vaping status: Never Used  Substance and Sexual Activity   Alcohol use: Yes    Comment: daily, at least 2 beers a day.   Drug use: Not Currently    Types: Cocaine, Marijuana, Methamphetamines   Sexual activity: Not on file  Other Topics Concern   Not on file  Social History Narrative   Not on file   Social Drivers of Health   Financial Resource Strain: Low Risk  (11/30/2021)   Received from [facility]   Overall Financial Resource Strain (CARDIA)    Difficulty of Paying Living Expenses: Not hard at all  Food Insecurity: No Food Insecurity (03/11/2024)   Hunger Vital Sign    Worried About Running Out of Food in the Last Year: Never true    Ran Out of Food in the Last Year: Never true  Transportation Needs: No Transportation Needs (03/11/2024)   PRAPARE - Scientist, research (physical sciences) (Medical): No    Lack of Transportation (Non-Medical): No  Physical Activity: Not on file  Stress: Not on file  Social Connections: Not on file  Intimate Partner Violence: Not At Risk (03/11/2024)   Humiliation, Afraid, Rape, and Kick questionnaire    Fear of Current or Ex-Partner: No    Emotionally Abused: No    Physically Abused: No    Sexually Abused: No    Review of Systems  All other systems reviewed and are negative.       Objective    BP (!) 160/110   Pulse 74   Temp (!) 97.4 F (36.3 C)   Ht 5\' 7"  (1.702 m)   Wt 189 lb (85.7 kg)   SpO2 98%   BMI 29.60 kg/m   Physical Exam Vitals and nursing note reviewed.  Constitutional:      Appearance: Normal appearance.  HENT:     Head: Normocephalic.     Right Ear: External ear normal.     Left Ear: External ear normal.  Eyes:     Conjunctiva/sclera: Conjunctivae normal.  Cardiovascular:     Rate and Rhythm: Normal rate.  Pulmonary:     Effort: Pulmonary effort is normal. No respiratory distress.  Abdominal:     Palpations: Abdomen is soft.  Musculoskeletal:        General: Normal range of motion.  Skin:    General: Skin is warm.  Neurological:     Mental Status: He is alert and oriented to person, place, and time.  Psychiatric:        Mood and Affect: Mood normal.      Assessment & Plan:   Problem List Items Addressed This Visit       Digestive   Chronic hepatitis C without hepatic coma (HCC)     Musculoskeletal and Integument   Lumbar spondylosis     Other   Polysubstance abuse (HCC)   Other Visit Diagnoses       Uncontrolled hypertension    -  Primary   Relevant Medications   lisinopril (ZESTRIL) 5 MG tablet   amLODipine  (NORVASC ) 5 MG tablet      HTN uncontrolled, not on any medications, Asymptomatic.  Pt aware of stroke and MI complications, he is aware of going to ED if needed. Starting Amlodipine  and Lisinopril as above, would like to do close follow up 2 weeks, pt  will call to schedule, states has transportation problem. Discussed side effects, Recommend getting BP log next visit, DASH diet  and Exercise. Unable to labs, pt not fasting. He is overdue for physical will schedule a physical once he recovers from surgery. Reviewed his preop labs : wnl. Pt is sober, Hep C s/p treatment per pt.  Return in about 4 weeks (around 04/08/2024) for HTN with PCP.   Vinary K Shirah Roseman, MD

## 2024-03-12 ENCOUNTER — Encounter (HOSPITAL_COMMUNITY): Payer: Self-pay

## 2024-03-12 NOTE — Anesthesia Preprocedure Evaluation (Addendum)
 Anesthesia Evaluation  Patient identified by MRN, date of birth, ID band Patient awake    Reviewed: Allergy & Precautions, NPO status , Patient's Chart, lab work & pertinent test results  Airway Mallampati: II  TM Distance: >3 FB Neck ROM: Limited    Dental  (+) Dental Advisory Given, Missing,    Pulmonary neg pulmonary ROS   Pulmonary exam normal breath sounds clear to auscultation       Cardiovascular hypertension, Pt. on medications Normal cardiovascular exam Rhythm:Regular Rate:Normal     Neuro/Psych  PSYCHIATRIC DISORDERS Anxiety Depression     Radiculopathy, lumbar region S/p ACDF  Neuromuscular disease    GI/Hepatic negative GI ROS,,,(+)     substance abuse  , Hepatitis -, C  Endo/Other  negative endocrine ROS    Renal/GU negative Renal ROS     Musculoskeletal  (+) Arthritis ,    Abdominal   Peds  Hematology negative hematology ROS (+)   Anesthesia Other Findings   Reproductive/Obstetrics                             Anesthesia Physical Anesthesia Plan  ASA: 3  Anesthesia Plan: General   Post-op Pain Management: Tylenol  PO (pre-op)* and Gabapentin  PO (pre-op)*   Induction: Intravenous  PONV Risk Score and Plan: 2 and Midazolam , Dexamethasone  and Ondansetron   Airway Management Planned: Oral ETT and Video Laryngoscope Planned  Additional Equipment:   Intra-op Plan:   Post-operative Plan: Extubation in OR  Informed Consent: I have reviewed the patients History and Physical, chart, labs and discussed the procedure including the risks, benefits and alternatives for the proposed anesthesia with the patient or authorized representative who has indicated his/her understanding and acceptance.     Dental advisory given  Plan Discussed with: CRNA  Anesthesia Plan Comments: (PAT note written by Allison Zelenak, PA-C.  )       Anesthesia Quick Evaluation

## 2024-03-16 ENCOUNTER — Ambulatory Visit (HOSPITAL_BASED_OUTPATIENT_CLINIC_OR_DEPARTMENT_OTHER): Payer: Self-pay | Admitting: Certified Registered Nurse Anesthetist

## 2024-03-16 ENCOUNTER — Encounter (HOSPITAL_COMMUNITY)
Admission: RE | Disposition: A | Discharge status: Home / Self Care | Payer: Self-pay | Source: Home / Self Care | Attending: Neurosurgery

## 2024-03-16 ENCOUNTER — Other Ambulatory Visit: Payer: Self-pay

## 2024-03-16 ENCOUNTER — Encounter (HOSPITAL_COMMUNITY): Payer: Self-pay | Admitting: Neurosurgery

## 2024-03-16 ENCOUNTER — Ambulatory Visit (HOSPITAL_COMMUNITY)

## 2024-03-16 ENCOUNTER — Observation Stay (HOSPITAL_COMMUNITY)
Admission: RE | Admit: 2024-03-16 | Discharge: 2024-03-18 | Disposition: A | Discharge status: Home / Self Care | Attending: Neurosurgery | Admitting: Neurosurgery

## 2024-03-16 ENCOUNTER — Ambulatory Visit (HOSPITAL_COMMUNITY): Payer: Self-pay | Admitting: Vascular Surgery

## 2024-03-16 DIAGNOSIS — M48061 Spinal stenosis, lumbar region without neurogenic claudication: Principal | ICD-10-CM | POA: Diagnosis present

## 2024-03-16 DIAGNOSIS — Z79899 Other long term (current) drug therapy: Secondary | ICD-10-CM | POA: Diagnosis not present

## 2024-03-16 DIAGNOSIS — F418 Other specified anxiety disorders: Secondary | ICD-10-CM

## 2024-03-16 DIAGNOSIS — M4807 Spinal stenosis, lumbosacral region: Secondary | ICD-10-CM | POA: Diagnosis present

## 2024-03-16 DIAGNOSIS — I10 Essential (primary) hypertension: Secondary | ICD-10-CM

## 2024-03-16 DIAGNOSIS — M5417 Radiculopathy, lumbosacral region: Secondary | ICD-10-CM | POA: Diagnosis not present

## 2024-03-16 LAB — ABO/RH: ABO/RH(D): A NEG

## 2024-03-16 SURGERY — POSTERIOR LUMBAR FUSION 1 LEVEL
Anesthesia: General | Site: Back

## 2024-03-16 MED ORDER — ACETAMINOPHEN 650 MG RE SUPP: 650.0000 mg | RECTAL | Status: DC | PRN

## 2024-03-16 MED ORDER — VANCOMYCIN HCL 1000 MG IV SOLR
INTRAVENOUS | Status: DC | PRN
  Administered 2024-03-16: 1000 mg

## 2024-03-16 MED ORDER — SENNA 8.6 MG PO TABS
1.0000 | ORAL_TABLET | Freq: Two times a day (BID) | ORAL | Status: DC | PRN
  Administered 2024-03-16: 8.6 mg via ORAL
  Filled 2024-03-16: qty 1

## 2024-03-16 MED ORDER — OXYCODONE HCL 5 MG PO TABS
5.0000 mg | ORAL_TABLET | Freq: Once | ORAL | Status: AC
  Administered 2024-03-16: 5 mg via ORAL

## 2024-03-16 MED ORDER — PROPOFOL 10 MG/ML IV BOLUS
INTRAVENOUS | Status: DC | PRN
  Administered 2024-03-16: 150 mg via INTRAVENOUS

## 2024-03-16 MED ORDER — OXYCODONE HCL 5 MG PO TABS
ORAL_TABLET | ORAL | Status: AC
  Filled 2024-03-16: qty 1

## 2024-03-16 MED ORDER — 0.9 % SODIUM CHLORIDE (POUR BTL) OPTIME
TOPICAL | Status: DC | PRN
  Administered 2024-03-16: 1000 mL

## 2024-03-16 MED ORDER — FENTANYL CITRATE (PF) 250 MCG/5ML IJ SOLN
INTRAMUSCULAR | Status: AC
  Filled 2024-03-16: qty 5

## 2024-03-16 MED ORDER — CHLORHEXIDINE GLUCONATE 0.12 % MT SOLN
15.0000 mL | Freq: Once | OROMUCOSAL | Status: AC
  Administered 2024-03-16: 15 mL via OROMUCOSAL
  Filled 2024-03-16: qty 15

## 2024-03-16 MED ORDER — BISACODYL 10 MG RE SUPP: 10.0000 mg | Freq: Every day | RECTAL | Status: DC | PRN

## 2024-03-16 MED ORDER — THROMBIN 20000 UNITS EX SOLR
CUTANEOUS | Status: DC | PRN
  Administered 2024-03-16: 20 mL via TOPICAL

## 2024-03-16 MED ORDER — HYDROCODONE-ACETAMINOPHEN 10-325 MG PO TABS: 1.0000 | ORAL_TABLET | ORAL | Status: DC | PRN

## 2024-03-16 MED ORDER — HYDROXYZINE HCL 50 MG/ML IM SOLN
50.0000 mg | Freq: Four times a day (QID) | INTRAMUSCULAR | Status: DC | PRN
  Administered 2024-03-16: 50 mg via INTRAMUSCULAR
  Filled 2024-03-16: qty 1

## 2024-03-16 MED ORDER — SODIUM CHLORIDE 0.9 % IV SOLN
250.0000 mL | INTRAVENOUS | Status: AC
  Administered 2024-03-16: 250 mL via INTRAVENOUS

## 2024-03-16 MED ORDER — FENTANYL CITRATE (PF) 100 MCG/2ML IJ SOLN
INTRAMUSCULAR | Status: AC
  Filled 2024-03-16: qty 2

## 2024-03-16 MED ORDER — GABAPENTIN 300 MG PO CAPS
300.0000 mg | ORAL_CAPSULE | Freq: Once | ORAL | Status: AC
  Administered 2024-03-16: 300 mg via ORAL
  Filled 2024-03-16: qty 1

## 2024-03-16 MED ORDER — HYDRALAZINE HCL 20 MG/ML IJ SOLN
INTRAMUSCULAR | Status: AC
  Filled 2024-03-16: qty 1

## 2024-03-16 MED ORDER — FENTANYL CITRATE (PF) 250 MCG/5ML IJ SOLN
INTRAMUSCULAR | Status: DC | PRN
  Administered 2024-03-16: 100 ug via INTRAVENOUS
  Administered 2024-03-16: 50 ug via INTRAVENOUS
  Administered 2024-03-16: 100 ug via INTRAVENOUS

## 2024-03-16 MED ORDER — LACTATED RINGERS IV SOLN: INTRAVENOUS | Status: DC

## 2024-03-16 MED ORDER — CHLORHEXIDINE GLUCONATE CLOTH 2 % EX PADS: 6.0000 | MEDICATED_PAD | Freq: Once | CUTANEOUS | Status: DC

## 2024-03-16 MED ORDER — ORAL CARE MOUTH RINSE: 15.0000 mL | Freq: Once | OROMUCOSAL | Status: DC

## 2024-03-16 MED ORDER — HYDROMORPHONE HCL 1 MG/ML IJ SOLN
INTRAMUSCULAR | Status: AC
  Filled 2024-03-16: qty 1

## 2024-03-16 MED ORDER — ORAL CARE MOUTH RINSE: 15.0000 mL | Freq: Once | OROMUCOSAL | Status: AC

## 2024-03-16 MED ORDER — PHENYLEPHRINE HCL-NACL 20-0.9 MG/250ML-% IV SOLN
INTRAVENOUS | Status: DC | PRN
Start: 2024-03-16 — End: 2024-03-16
  Administered 2024-03-16: 20 ug/min via INTRAVENOUS

## 2024-03-16 MED ORDER — SUGAMMADEX SODIUM 200 MG/2ML IV SOLN
INTRAVENOUS | Status: DC | PRN
  Administered 2024-03-16: 200 mg via INTRAVENOUS

## 2024-03-16 MED ORDER — CEFAZOLIN SODIUM-DEXTROSE 1-4 GM/50ML-% IV SOLN
1.0000 g | Freq: Three times a day (TID) | INTRAVENOUS | Status: AC
  Administered 2024-03-16 – 2024-03-17 (×2): 1 g via INTRAVENOUS
  Filled 2024-03-16 (×2): qty 50

## 2024-03-16 MED ORDER — ONDANSETRON HCL 4 MG/2ML IJ SOLN
INTRAMUSCULAR | Status: DC | PRN
  Administered 2024-03-16: 4 mg via INTRAVENOUS

## 2024-03-16 MED ORDER — SODIUM CHLORIDE 0.9% FLUSH: 3.0000 mL | INTRAVENOUS | Status: DC | PRN

## 2024-03-16 MED ORDER — VANCOMYCIN HCL 1000 MG IV SOLR
INTRAVENOUS | Status: AC
  Filled 2024-03-16: qty 20

## 2024-03-16 MED ORDER — THROMBIN 20000 UNITS EX SOLR
CUTANEOUS | Status: AC
  Filled 2024-03-16: qty 20000

## 2024-03-16 MED ORDER — FLEET ENEMA RE ENEM: 1.0000 | ENEMA | Freq: Once | RECTAL | Status: DC | PRN

## 2024-03-16 MED ORDER — ROCURONIUM BROMIDE 10 MG/ML (PF) SYRINGE
PREFILLED_SYRINGE | INTRAVENOUS | Status: DC | PRN
  Administered 2024-03-16: 10 mg via INTRAVENOUS
  Administered 2024-03-16: 60 mg via INTRAVENOUS
  Administered 2024-03-16: 10 mg via INTRAVENOUS
  Administered 2024-03-16: 20 mg via INTRAVENOUS

## 2024-03-16 MED ORDER — LISINOPRIL 5 MG PO TABS
5.0000 mg | ORAL_TABLET | Freq: Every day | ORAL | Status: DC
  Administered 2024-03-16 – 2024-03-18 (×3): 5 mg via ORAL
  Filled 2024-03-16 (×3): qty 1

## 2024-03-16 MED ORDER — HYDROMORPHONE HCL 1 MG/ML IJ SOLN
INTRAMUSCULAR | Status: AC
Start: 2024-03-16 — End: ?
  Filled 2024-03-16: qty 0.5

## 2024-03-16 MED ORDER — CHLORHEXIDINE GLUCONATE 0.12 % MT SOLN: 15.0000 mL | Freq: Once | OROMUCOSAL | Status: DC

## 2024-03-16 MED ORDER — CEFAZOLIN SODIUM-DEXTROSE 2-4 GM/100ML-% IV SOLN
2.0000 g | INTRAVENOUS | Status: AC
  Administered 2024-03-16: 2 g via INTRAVENOUS
  Filled 2024-03-16: qty 100

## 2024-03-16 MED ORDER — FENTANYL CITRATE (PF) 100 MCG/2ML IJ SOLN
25.0000 ug | INTRAMUSCULAR | Status: DC | PRN
  Administered 2024-03-16: 50 ug via INTRAVENOUS
  Administered 2024-03-16 (×2): 25 ug via INTRAVENOUS

## 2024-03-16 MED ORDER — HYDRALAZINE HCL 20 MG/ML IJ SOLN: 10.0000 mg | Freq: Once | INTRAMUSCULAR | Status: DC

## 2024-03-16 MED ORDER — MENTHOL 3 MG MT LOZG: 1.0000 | LOZENGE | OROMUCOSAL | Status: DC | PRN

## 2024-03-16 MED ORDER — MIDAZOLAM HCL 2 MG/2ML IJ SOLN
INTRAMUSCULAR | Status: DC | PRN
  Administered 2024-03-16: 2 mg via INTRAVENOUS

## 2024-03-16 MED ORDER — PHENOL 1.4 % MT LIQD: 1.0000 | OROMUCOSAL | Status: DC | PRN

## 2024-03-16 MED ORDER — MIDAZOLAM HCL 2 MG/2ML IJ SOLN
INTRAMUSCULAR | Status: AC
  Filled 2024-03-16: qty 2

## 2024-03-16 MED ORDER — HYDROCODONE-ACETAMINOPHEN 10-325 MG PO TABS
1.0000 | ORAL_TABLET | ORAL | Status: DC | PRN
  Administered 2024-03-16: 1 via ORAL
  Administered 2024-03-17 – 2024-03-18 (×7): 2 via ORAL
  Filled 2024-03-16 (×3): qty 2
  Filled 2024-03-16: qty 1
  Filled 2024-03-16 (×4): qty 2

## 2024-03-16 MED ORDER — AMLODIPINE BESYLATE 5 MG PO TABS
5.0000 mg | ORAL_TABLET | Freq: Every day | ORAL | Status: DC
  Administered 2024-03-17 – 2024-03-18 (×2): 5 mg via ORAL
  Filled 2024-03-16 (×2): qty 1

## 2024-03-16 MED ORDER — AMISULPRIDE (ANTIEMETIC) 5 MG/2ML IV SOLN: 10.0000 mg | Freq: Once | INTRAVENOUS | Status: DC | PRN

## 2024-03-16 MED ORDER — METHOCARBAMOL 1000 MG/10ML IJ SOLN: 500.0000 mg | Freq: Four times a day (QID) | INTRAMUSCULAR | Status: DC | PRN

## 2024-03-16 MED ORDER — ONDANSETRON HCL 4 MG/2ML IJ SOLN
4.0000 mg | Freq: Four times a day (QID) | INTRAMUSCULAR | Status: DC | PRN
  Administered 2024-03-16: 4 mg via INTRAVENOUS
  Filled 2024-03-16: qty 2

## 2024-03-16 MED ORDER — ONDANSETRON HCL 4 MG/2ML IJ SOLN: 4.0000 mg | Freq: Once | INTRAMUSCULAR | Status: DC | PRN

## 2024-03-16 MED ORDER — EPHEDRINE SULFATE-NACL 50-0.9 MG/10ML-% IV SOSY
PREFILLED_SYRINGE | INTRAVENOUS | Status: DC | PRN
  Administered 2024-03-16: 5 mg via INTRAVENOUS

## 2024-03-16 MED ORDER — METHOCARBAMOL 500 MG PO TABS
500.0000 mg | ORAL_TABLET | Freq: Four times a day (QID) | ORAL | Status: DC | PRN
  Administered 2024-03-16 – 2024-03-17 (×4): 500 mg via ORAL
  Filled 2024-03-16 (×3): qty 1

## 2024-03-16 MED ORDER — HYDROMORPHONE HCL 1 MG/ML IJ SOLN
1.0000 mg | INTRAMUSCULAR | Status: DC | PRN
  Administered 2024-03-16 (×2): 1 mg via INTRAVENOUS
  Filled 2024-03-16 (×2): qty 1

## 2024-03-16 MED ORDER — DEXAMETHASONE SODIUM PHOSPHATE 10 MG/ML IJ SOLN
INTRAMUSCULAR | Status: DC | PRN
  Administered 2024-03-16: 10 mg via INTRAVENOUS

## 2024-03-16 MED ORDER — POLYETHYLENE GLYCOL 3350 17 G PO PACK: 17.0000 g | PACK | Freq: Every day | ORAL | Status: DC | PRN

## 2024-03-16 MED ORDER — ONDANSETRON HCL 4 MG PO TABS: 4.0000 mg | ORAL_TABLET | Freq: Four times a day (QID) | ORAL | Status: DC | PRN

## 2024-03-16 MED ORDER — METHOCARBAMOL 500 MG PO TABS
ORAL_TABLET | ORAL | Status: AC
  Filled 2024-03-16: qty 1

## 2024-03-16 MED ORDER — KETAMINE HCL 50 MG/5ML IJ SOSY
PREFILLED_SYRINGE | INTRAMUSCULAR | Status: AC
  Filled 2024-03-16: qty 5

## 2024-03-16 MED ORDER — HYDROMORPHONE HCL 1 MG/ML IJ SOLN
INTRAMUSCULAR | Status: DC | PRN
  Administered 2024-03-16: .5 mg via INTRAVENOUS

## 2024-03-16 MED ORDER — DOCUSATE SODIUM 100 MG PO CAPS
100.0000 mg | ORAL_CAPSULE | Freq: Two times a day (BID) | ORAL | Status: DC
  Administered 2024-03-16 – 2024-03-18 (×4): 100 mg via ORAL
  Filled 2024-03-16 (×4): qty 1

## 2024-03-16 MED ORDER — ACETAMINOPHEN 500 MG PO TABS
1000.0000 mg | ORAL_TABLET | Freq: Once | ORAL | Status: AC
  Administered 2024-03-16: 1000 mg via ORAL
  Filled 2024-03-16: qty 2

## 2024-03-16 MED ORDER — KETAMINE HCL 50 MG/5ML IJ SOSY
PREFILLED_SYRINGE | INTRAMUSCULAR | Status: DC | PRN
  Administered 2024-03-16: 30 mg via INTRAVENOUS
  Administered 2024-03-16: 20 mg via INTRAVENOUS

## 2024-03-16 MED ORDER — ACETAMINOPHEN 325 MG PO TABS: 650.0000 mg | ORAL_TABLET | ORAL | Status: DC | PRN

## 2024-03-16 MED ORDER — BUPIVACAINE HCL (PF) 0.25 % IJ SOLN
INTRAMUSCULAR | Status: AC
  Filled 2024-03-16: qty 30

## 2024-03-16 MED ORDER — BUPIVACAINE HCL (PF) 0.25 % IJ SOLN
INTRAMUSCULAR | Status: DC | PRN
  Administered 2024-03-16: 20 mL

## 2024-03-16 MED ORDER — LIDOCAINE 2% (20 MG/ML) 5 ML SYRINGE
INTRAMUSCULAR | Status: DC | PRN
  Administered 2024-03-16: 100 mg via INTRAVENOUS

## 2024-03-16 MED ORDER — HYDROMORPHONE HCL 1 MG/ML IJ SOLN
0.5000 mg | INTRAMUSCULAR | Status: DC | PRN
  Administered 2024-03-16 (×2): 0.5 mg via INTRAVENOUS

## 2024-03-16 MED ORDER — SODIUM CHLORIDE 0.9% FLUSH
3.0000 mL | Freq: Two times a day (BID) | INTRAVENOUS | Status: DC
  Administered 2024-03-16 – 2024-03-17 (×2): 3 mL via INTRAVENOUS

## 2024-03-16 SURGICAL SUPPLY — 51 items
BAG COUNTER SPONGE SURGICOUNT (BAG) ×1 IMPLANT
BENZOIN TINCTURE PRP APPL 2/3 (GAUZE/BANDAGES/DRESSINGS) ×1 IMPLANT
BLADE BONE MILL MEDIUM (MISCELLANEOUS) ×1 IMPLANT
BLADE CLIPPER SURG (BLADE) IMPLANT
BUR MATCHSTICK NEURO 3.0 LAGG (BURR) ×1 IMPLANT
CAGE INTERBODY PL LG 7X26.5X24 (Cage) IMPLANT
CANISTER SUCTION 3000ML PPV (SUCTIONS) ×1 IMPLANT
CNTNR URN SCR LID CUP LEK RST (MISCELLANEOUS) ×1 IMPLANT
COVER BACK TABLE 60X90IN (DRAPES) ×1 IMPLANT
DERMABOND ADVANCED .7 DNX12 (GAUZE/BANDAGES/DRESSINGS) ×1 IMPLANT
DRAPE C-ARM 42X72 X-RAY (DRAPES) ×2 IMPLANT
DRAPE HALF SHEET 40X57 (DRAPES) IMPLANT
DRAPE LAPAROTOMY 100X72X124 (DRAPES) ×1 IMPLANT
DRAPE SURG 17X23 STRL (DRAPES) ×4 IMPLANT
DRSG OPSITE POSTOP 4X6 (GAUZE/BANDAGES/DRESSINGS) ×1 IMPLANT
DURAPREP 26ML APPLICATOR (WOUND CARE) ×1 IMPLANT
ELECTRODE REM PT RTRN 9FT ADLT (ELECTROSURGICAL) ×1 IMPLANT
EVACUATOR 1/8 PVC DRAIN (DRAIN) IMPLANT
GAUZE 4X4 16PLY ~~LOC~~+RFID DBL (SPONGE) IMPLANT
GAUZE SPONGE 4X4 12PLY STRL (GAUZE/BANDAGES/DRESSINGS) IMPLANT
GLOVE BIO SURGEON STRL SZ 6 (GLOVE) ×1 IMPLANT
GLOVE BIOGEL PI IND STRL 6.5 (GLOVE) ×1 IMPLANT
GLOVE BIOGEL PI IND STRL 7.5 (GLOVE) IMPLANT
GLOVE ECLIPSE 9.0 STRL (GLOVE) ×2 IMPLANT
GLOVE EXAM NITRILE XL STR (GLOVE) IMPLANT
GLOVE SURG SS PI 6.5 STRL IVOR (GLOVE) IMPLANT
GOWN STRL REUS W/ TWL LRG LVL3 (GOWN DISPOSABLE) IMPLANT
GOWN STRL REUS W/ TWL XL LVL3 (GOWN DISPOSABLE) ×2 IMPLANT
GOWN STRL REUS W/TWL 2XL LVL3 (GOWN DISPOSABLE) IMPLANT
KIT BASIN OR (CUSTOM PROCEDURE TRAY) ×1 IMPLANT
KIT TURNOVER KIT B (KITS) ×1 IMPLANT
NDL HYPO 22X1.5 SAFETY MO (MISCELLANEOUS) ×1 IMPLANT
NEEDLE HYPO 22X1.5 SAFETY MO (MISCELLANEOUS) ×1 IMPLANT
NS IRRIG 1000ML POUR BTL (IV SOLUTION) ×1 IMPLANT
PACK LAMINECTOMY NEURO (CUSTOM PROCEDURE TRAY) ×1 IMPLANT
PUTTY GRAFTON DBF 6CC W/DELIVE (Putty) IMPLANT
ROD RADIUS 35MM (Rod) IMPLANT
SCREW POLYAXIAL 6.5X40 (Screw) IMPLANT
SCREW POLYAXIAL 6.5X45MM (Screw) IMPLANT
SET SCREW VRST (Screw) IMPLANT
SPIKE FLUID TRANSFER (MISCELLANEOUS) ×1 IMPLANT
SPONGE SURGIFOAM ABS GEL 100 (HEMOSTASIS) ×1 IMPLANT
STRIP CLOSURE SKIN 1/2X4 (GAUZE/BANDAGES/DRESSINGS) ×2 IMPLANT
SUT VIC AB 0 CT1 18XCR BRD8 (SUTURE) ×2 IMPLANT
SUT VIC AB 2-0 CT1 18 (SUTURE) ×1 IMPLANT
SUT VIC AB 3-0 SH 8-18 (SUTURE) ×2 IMPLANT
TOWEL GREEN STERILE (TOWEL DISPOSABLE) ×1 IMPLANT
TOWEL GREEN STERILE FF (TOWEL DISPOSABLE) ×1 IMPLANT
TRAY FOLEY MTR SLVR 14FR STAT (SET/KITS/TRAYS/PACK) IMPLANT
TRAY FOLEY MTR SLVR 16FR STAT (SET/KITS/TRAYS/PACK) ×1 IMPLANT
WATER STERILE IRR 1000ML POUR (IV SOLUTION) ×1 IMPLANT

## 2024-03-16 NOTE — Brief Op Note (Signed)
 03/16/2024  3:07 PM  PATIENT:  Phillip Mcdaniel  53 y.o. male  PRE-OPERATIVE DIAGNOSIS:  RADICULOPATHY, LUMBAR REGION  POST-OPERATIVE DIAGNOSIS:  * No post-op diagnosis entered *  PROCEDURE:  Procedure(s): POSTERIOR LUMBAR INTERBODY FUSION LUMBAR FIVE SACRAL ONE (N/A)  SURGEON:  Surgeons and Role:    * Agustina Aldrich, MD - Primary    * Elna Haggis, MD - Assisting  PHYSICIAN ASSISTANT:   ASSISTANTS:    ANESTHESIA:   general  EBL:  200cc   BLOOD ADMINISTERED:none  DRAINS: none   LOCAL MEDICATIONS USED:  MARCAINE      SPECIMEN:  No Specimen  DISPOSITION OF SPECIMEN:  N/A  COUNTS:  YES  TOURNIQUET:  * No tourniquets in log *  DICTATION: .Dragon Dictation  PLAN OF CARE: Admit for overnight observation  PATIENT DISPOSITION:  PACU - hemodynamically stable.   Delay start of Pharmacological VTE agent (>24hrs) due to surgical blood loss or risk of bleeding: yes

## 2024-03-16 NOTE — Anesthesia Postprocedure Evaluation (Signed)
 Anesthesia Post Note  Patient: Phillip Mcdaniel  Procedure(s) Performed: POSTERIOR LUMBAR INTERBODY FUSION LUMBAR FIVE SACRAL ONE (Back)     Patient location during evaluation: PACU Anesthesia Type: General Level of consciousness: awake and alert Pain management: pain level controlled Vital Signs Assessment: post-procedure vital signs reviewed and stable Respiratory status: spontaneous breathing, nonlabored ventilation and respiratory function stable Cardiovascular status: blood pressure returned to baseline and stable Postop Assessment: no apparent nausea or vomiting Anesthetic complications: no   No notable events documented.  Last Vitals:  Vitals:   03/16/24 1620 03/16/24 1649  BP:  (!) 154/95  Pulse: 71 89  Resp: 16 18  Temp:  36.6 C  SpO2: 100% 97%    Last Pain:  Vitals:   03/16/24 1659  TempSrc:   PainSc: 6                  Erin Havers

## 2024-03-16 NOTE — Anesthesia Procedure Notes (Signed)
 Procedure Name: Intubation Date/Time: 03/16/2024 12:27 PM  Performed by: Linard Reno, CRNAPre-anesthesia Checklist: Patient identified, Emergency Drugs available, Suction available and Patient being monitored Patient Re-evaluated:Patient Re-evaluated prior to induction Oxygen Delivery Method: Circle System Utilized Preoxygenation: Pre-oxygenation with 100% oxygen Induction Type: IV induction Ventilation: Mask ventilation without difficulty Laryngoscope Size: Glidescope and 4 Grade View: Grade I Tube type: Oral Tube size: 7.5 mm Number of attempts: 1 Airway Equipment and Method: Stylet, Oral airway and Video-laryngoscopy Placement Confirmation: ETT inserted through vocal cords under direct vision, positive ETCO2 and breath sounds checked- equal and bilateral Secured at: 23 cm Tube secured with: Tape Dental Injury: Teeth and Oropharynx as per pre-operative assessment

## 2024-03-16 NOTE — H&P (Signed)
 Phillip Mcdaniel is an 53 y.o. male.   Chief Complaint: Back pain HPI: 53 year old male with chronic and progressively worsening lumbar pain with radiation to his lower extremities left greater than right.  Workup demonstrates evidence of severe multilevel lumbar disc degeneration worse at L5-S1 where he has a chronic broad-based disc herniation on the left side extending into his left L5 neuroforamen with severe compression of his left L5 and S1 nerve roots.  Patient has failed conservative management presents now for L5-S1 decompression and fusion in hopes improving his symptoms.  Past Medical History:  Diagnosis Date   Anxiety    Arthritis    AVN (avascular necrosis of bone) (HCC)    RIGHT hip   Chronic neck pain    Depression    ED (erectile dysfunction)    Hepatitis    Hepatitis C s/p treatment wtih Mayvret   Hypertension    Lumbar spondylolysis     Past Surgical History:  Procedure Laterality Date   BACK SURGERY     anterior cervical   HIP SURGERY     RIGHT hip replacement   NECK SURGERY  2024   C3-6    Family History  Problem Relation Age of Onset   Hypertension Mother    Hypertension Father    Heart disease Father    Hypertension Sister    Depression Sister    Hypertension Maternal Grandmother    Hypertension Maternal Grandfather    Hypertension Paternal Grandmother    Hypertension Paternal Grandfather    Social History:  reports that he has never smoked. He has never used smokeless tobacco. He reports current alcohol use. He reports that he does not currently use drugs after having used the following drugs: Cocaine, Marijuana, and Methamphetamines.  Allergies: No Known Allergies  Medications Prior to Admission  Medication Sig Dispense Refill   acetaminophen  (TYLENOL ) 500 MG tablet Take 500-1,000 mg by mouth every 6 (six) hours as needed (pain.).     amLODipine  (NORVASC ) 5 MG tablet Take 1 tablet (5 mg total) by mouth daily. 90 tablet 0   lisinopril   (ZESTRIL ) 5 MG tablet Take 1 tablet (5 mg total) by mouth daily. 90 tablet 0   hydrOXYzine  (ATARAX ) 25 MG tablet Take 25 mg by mouth every 6 (six) hours. (Patient not taking: Reported on 03/11/2024)     pregabalin (LYRICA) 100 MG capsule Take 100 mg by mouth 3 (three) times daily. (Patient not taking: Reported on 03/11/2024)      Results for orders placed or performed during the hospital encounter of 03/16/24 (from the past 48 hours)  ABO/Rh     Status: None   Collection Time: 03/16/24  9:25 AM  Result Value Ref Range   ABO/RH(D)      A NEG Performed at [facility], [address] [place], [place] [address]    No results found.  Pertinent items noted in HPI and remainder of comprehensive ROS otherwise negative.  Blood pressure (!) 152/101, pulse (!) 59, temperature 97.6 F (36.4 C), temperature source Oral, resp. rate 17, height 5\' 7"  (1.702 m), weight 86.2 kg, SpO2 97%.  Patient is awake and alert.  He is oriented and appropriate.  Speech is fluent.  Judgment insight are intact.  Cranial nerve function normal bladder.  Motor examination reveals mild weakness of his left sided dorsiflexors and left gastrocnemius muscle.  Otherwise motor strength intact.  Sensory examination reveals decrease sensation to very light touch in his left L5 and S1 dermatomes.  Deep tendon rhexis are normal active septa his Achilles reflexes are absent.  Gait is antalgic.  Posture is reasonably normal peer examination head ears eyes nose throat is unremarked.  Chest and abdomen are benign.  Extremities free from injury deformity. Assessment/Plan L5-S1 stenosis with radiculopathy.  Plan bilateral L5-S1 decompressive laminotomies and foraminotomies followed by posterior lumbar body fusion utilizing interbody cages, THAT autograft, and augmented with posterior arthrodesis utilizing nonsegmental pedicle screw fixation and local autograft.  Risks and benefits been explained.  Patient wishes proceed.  Ace Abu A  Michaelah Credeur 03/16/2024, 11:27 AM

## 2024-03-16 NOTE — Progress Notes (Signed)
 Orthopedic Tech Progress Note Patient Details:  Phillip Mcdaniel [birth year] [id number]  Ortho Devices Type of Ortho Device: Lumbar corsett Ortho Device/Splint Location: BACK Ortho Device/Splint Interventions: Ordered   Post Interventions Patient Tolerated: Well Instructions Provided: Care of device  Kermitt Pedlar 03/16/2024, 6:11 PM

## 2024-03-16 NOTE — Transfer of Care (Signed)
 Immediate Anesthesia Transfer of Care Note  Patient: Phillip Mcdaniel  Procedure(s) Performed: POSTERIOR LUMBAR INTERBODY FUSION LUMBAR FIVE SACRAL ONE (Back)  Patient Location: PACU  Anesthesia Type:General  Level of Consciousness: awake  Airway & Oxygen Therapy: Patient Spontanous Breathing and Patient connected to face mask oxygen  Post-op Assessment: Report given to RN, Post -op Vital signs reviewed and stable, and Patient moving all extremities  Post vital signs: Reviewed and stable  Last Vitals:  Vitals Value Taken Time  BP 167/108 03/16/24 1530  Temp    Pulse 72 03/16/24 1530  Resp 23 03/16/24 1530  SpO2 100 % 03/16/24 1530  Vitals shown include unfiled device data.  Last Pain:  Vitals:   03/16/24 0919  TempSrc:   PainSc: 10-Worst pain ever         Complications: No notable events documented.

## 2024-03-16 NOTE — Op Note (Signed)
 Date of procedure: 03/16/2024  Date of dictation: Same  Service: Neurosurgery  Preoperative diagnosis: L5-S1 stenosis, foraminal stenosis and radiculopathy  Postoperative diagnosis: Same  Procedure Name: Bilateral L5-S1 decompressive laminotomies and foraminotomies, more than would be required for simple interbody fusion alone.  L5-S1 posterior lumbar interbody fusion utilizing interbody cages, local harvested autograft, and morselized allograft  L5-S1 posterior lateral arthrodesis utilizing nonsegmental pedicle screw fixation and local autografting.  Surgeon:Shandra Szymborski A.Iwalani Templeton, M.D.  Asst. Surgeon: Sabra Cramp, NP  Anesthesia: General  Indication: 53 year old male with history of chronic and progressively worsening back and lower extremity symptoms failing conservative manage.  Workup demonstrates evidence of multilevel lumbar degenerative disease.  At L5-S1 he has marked disc space collapse with broad-based calcified disc herniation and severe neuroforaminal stenosis left greater than right.  Patient has failed conservative management presents now for L5-S1 decompression and fusion in hopes improving of symptoms.  Operative note: After induction of anesthesia, patient edition prone onto Wilson frame and appropriately padded.  Lumbar region prepped and draped sterilely.  Incision made overlying L5-S1.  Dissection performed bilaterally.  Retractor placed.  Fluoroscopy used.  Levels confirmed.  Decompressive laminotomies and facetectomies were then performed bilaterally using Leksell rongeurs and Kerrison rongeurs to remove the inferior two thirds of the lamina of L5 bilaterally the entire inferior facet of L5 and pars interarticularis was also resected bilaterally the majority the superior facet of S1 was resected bilaterally and the superior rim of the S1 lamina was resected.  Ligament flavum elevated and resected.  Foraminotomies complete on the course the exiting L5 and S1 nerve roots providing  generous decompression to the exiting nerve roots.  Bilateral discectomy was then performed at L5-S1.  The space then prepared for interbody fusion.  With the disc base distractor on the patient's right side disc base was then prepared on the left side.  A 7 mm [employer] expandable cage was then impacted into place and expanded.  Distractor removed patient's right side.  Disc base prepared on the right side.  Morselized autograft packed in the interspace.  A second cage was then impacted into place and expanded.  Pedicles of L5 and S1 were identified using surface landmarks and intraoperative fluoroscopy exposure bone overlying the pedicle was then removed using high-speed drill.  Pedicle was then probed using a pedicle awl.  Each pedicle all track was then probed and found to be solidly within the bone.  Each pedicle all track was then tapped with a screw tap.  Screw table was probed and found to be solidly within the bone.  6.5 mm Everest screws from [employer] placed bilaterally at L5 and S1.  Final images reveal position of the cages and the hardware at the proper level with normal alignment of spine.  Each cage was then packed with demineralized bone fibers.  Gelfoam was placed over the laminotomy defects.  Transverse processes and sacral ala were decorticated.  Morselized autograft was packed posterolaterally.  Short segment titanium rod placed over the screws at L5 and S1.  Locking caps placed over the screws.  Locking caps then engaged with the construct under compression.  Wound was irrigated 1 final time.  Vancomycin  powder was placed deep wound space.  Wounds then closed in layers with Vicryl sutures.  Steri-Strips and sterile dressing were applied.  No apparent complications.  Patient tolerated the procedure well and he returns to the recovery room postop.

## 2024-03-17 DIAGNOSIS — M4807 Spinal stenosis, lumbosacral region: Secondary | ICD-10-CM | POA: Diagnosis not present

## 2024-03-17 NOTE — Evaluation (Signed)
 Physical Therapy Evaluation  Patient Details Name: Phillip Mcdaniel MRN: [id number] DOB: Mar 25, 1971 Today's Date: 03/17/2024  History of Present Illness  Pt is a 53 y/o male who presents s/p bilateral L5-S1 decompressive lamintomies and foraminotomies and fusion as well as L5-S1 posterior lateral arthrodesis on 03/16/2024. PMH significant for anxiety, OA, AVN of right hip, chronic neck pain, depression, HTN, anterior cervical sx, right hip replacement.   Clinical Impression  Pt admitted with above diagnosis. At the time of PT eval, pt was able to demonstrate transfers and ambulation with gross supervision to modified independence and RW for support. Pt was educated on precautions, brace application/wearing schedule, appropriate activity progression, and car transfer. Pt currently with functional limitations due to the deficits listed below (see PT Problem List). Pt will benefit from skilled PT to increase their independence and safety with mobility to allow discharge to the venue listed below.        If plan is discharge home, recommend the following: A little help with walking and/or transfers;A little help with bathing/dressing/bathroom;Assistance with cooking/housework;Assist for transportation;Help with stairs or ramp for entrance   Can travel by private vehicle        Equipment Recommendations Rolling walker (2 wheels)  Recommendations for Other Services       Functional Status Assessment Patient has had a recent decline in their functional status and demonstrates the ability to make significant improvements in function in a reasonable and predictable amount of time.     Precautions / Restrictions Precautions Precautions: Back;Fall Precaution Booklet Issued: Yes (comment) Recall of Precautions/Restrictions: Intact Precaution/Restrictions Comments: Reviewed handout and pt was cued for precautions during functional mobility. Required Braces or Orthoses: Spinal Brace Spinal Brace:  Lumbar corset;Applied in sitting position Restrictions Weight Bearing Restrictions Per Provider Order: No      Mobility  Bed Mobility Overal bed mobility: Modified Independent             General bed mobility comments: HOB flat and rails lowered to simulate home environment.    Transfers Overall transfer level: Modified independent Equipment used: Rolling walker (2 wheels)               General transfer comment: Pt demonstrated proper hand placement on seated surface for safety. No assist required.    Ambulation/Gait Ambulation/Gait assistance: Supervision Gait Distance (Feet): 300 Feet Assistive device: Rolling walker (2 wheels) Gait Pattern/deviations: Step-through pattern, Decreased stride length, Trunk flexed Gait velocity: Decreased Gait velocity interpretation: <1.8 ft/sec, indicate of risk for recurrent falls   General Gait Details: VC's for improved posture, closer walker proximity, and forward gaze. No assist required. Walker adjusted for appropriate height.  Stairs Stairs: Yes Stairs assistance: Contact guard assist Stair Management: One rail Right, Step to pattern, Forwards Number of Stairs: 4 General stair comments: VC's for sequencing and general safety.  Wheelchair Mobility     Tilt Bed    Modified Rankin (Stroke Patients Only)       Balance Overall balance assessment: Independent                                           Pertinent Vitals/Pain Pain Assessment Pain Assessment: Faces Faces Pain Scale: Hurts little more Pain Location: incisional Pain Descriptors / Indicators: Aching, Grimacing, Guarding, Sore Pain Intervention(s): Limited activity within patient's tolerance, Monitored during session, Repositioned    Home Living Family/patient expects to be discharged  to:: Private residence Living Arrangements: Spouse/significant other;Children;Other relatives Available Help at Discharge: Family;Available  PRN/intermittently Type of Home: House Home Access: Stairs to enter Entrance Stairs-Rails: Right;Left;Can reach both Entrance Stairs-Number of Steps: 4   Home Layout: One level Home Equipment: Hand held shower head      Prior Function Prior Level of Function : Independent/Modified Independent                     Extremity/Trunk Assessment   Upper Extremity Assessment Upper Extremity Assessment: Defer to OT evaluation    Lower Extremity Assessment Lower Extremity Assessment: Generalized weakness (Mild; consistent with pre-op diagnosis)    Cervical / Trunk Assessment Cervical / Trunk Assessment: Back Surgery  Communication   Communication Communication: No apparent difficulties    Cognition Arousal: Alert Behavior During Therapy: WFL for tasks assessed/performed   PT - Cognitive impairments: No apparent impairments                         Following commands: Intact       Cueing Cueing Techniques: Verbal cues     General Comments      Exercises     Assessment/Plan    PT Assessment Patient needs continued PT services  PT Problem List Decreased strength;Decreased activity tolerance;Decreased balance;Decreased mobility;Decreased knowledge of use of DME;Decreased safety awareness;Decreased knowledge of precautions;Pain       PT Treatment Interventions DME instruction;Gait training;Stair training;Functional mobility training;Therapeutic activities;Therapeutic exercise;Balance training;Patient/family education    PT Goals (Current goals can be found in the Care Plan section)  Acute Rehab PT Goals Patient Stated Goal: Home today PT Goal Formulation: With patient Time For Goal Achievement: 03/24/24 Potential to Achieve Goals: Good    Frequency Min 5X/week     Co-evaluation               AM-PAC PT "6 Clicks" Mobility  Outcome Measure Help needed turning from your back to your side while in a flat bed without using bedrails?:  None Help needed moving from lying on your back to sitting on the side of a flat bed without using bedrails?: None Help needed moving to and from a bed to a chair (including a wheelchair)?: A Little Help needed standing up from a chair using your arms (e.g., wheelchair or bedside chair)?: None Help needed to walk in hospital room?: A Little Help needed climbing 3-5 steps with a railing? : A Little 6 Click Score: 21    End of Session Equipment Utilized During Treatment: Gait belt;Back brace Activity Tolerance: Patient tolerated treatment well Patient left: in bed;with call bell/phone within reach Nurse Communication: Mobility status PT Visit Diagnosis: Unsteadiness on feet (R26.81);Pain Pain - part of body:  (back)    Time: 1308-6578 PT Time Calculation (min) (ACUTE ONLY): 17 min   Charges:   PT Evaluation $PT Eval Low Complexity: 1 Low   PT General Charges $$ ACUTE PT VISIT: 1 Visit         Simone Dubois, PT, DPT Acute Rehabilitation Services Secure Chat Preferred Office: [phone]   Venus Ginsberg 03/17/2024, 11:38 AM

## 2024-03-17 NOTE — Care Management (Signed)
  Unit staff to provide DME needed for home.   No HH needs identified

## 2024-03-17 NOTE — Progress Notes (Signed)
 Postop day 1.  Patient overall doing well.  His back pain is improved.  Just complains of incisional pain.  No severe radicular pain numbness or weakness.  Patient with limited support at home and feels like he would benefit from additional day in the hospital.  He is awake and alert.  He is oriented and appropriate.  Urine output is good.  Vital signs are stable.  He is neurologically intact.  Wound clean and dry.  Abdomen soft.

## 2024-03-17 NOTE — Evaluation (Signed)
 Occupational Therapy Evaluation  and Discharge Patient Details Name: Phillip Mcdaniel MRN: [id number] DOB: 1971/01/07 Today's Date: 03/17/2024   History of Present Illness   Pt is a 53 yo male s/p bilateral L5-S1 decompressive lamintomies and foraminotomies and fusion as well as L5-S1 posterior lateral arthrodesis. PHMx: anxiety, OA, AVN of right hip, chronic neck pain, depression, HTN, anterior cervical sx, right hip replacement.     Clinical Impressions This 53 yo male admitted and underwent above presents to acute OT with all education completed and post op back handout provided and reviewed with pt return demonstrating or verbalizing information. No further OT needs, we will D/C from acute OT.     If plan is discharge home, recommend the following:   A little help with bathing/dressing/bathroom     Functional Status Assessment   Patient has had a recent decline in their functional status and demonstrates the ability to make significant improvements in function in a reasonable and predictable amount of time. (without further need for skilled OT)     Equipment Recommendations   Tub/shower seat;Other (comment) (RW)     Precautions/Restrictions   Precautions Precautions: Back Precaution Booklet Issued: Yes (comment) Recall of Precautions/Restrictions: Intact Required Braces or Orthoses: Spinal Brace Spinal Brace: Lumbar corset;Applied in sitting position Restrictions Weight Bearing Restrictions Per Provider Order: No     Mobility Bed Mobility Overal bed mobility: Modified Independent                  Transfers Overall transfer level: Modified independent                 General transfer comment: educated on sit to stand stance to keep back more straight      Balance Overall balance assessment: Independent                                         ADL either performed or assessed with clinical judgement   ADL Overall ADL's :  Modified independent                                       General ADL Comments: Educated on use of 2 cups for mouth care, use of wet wipes for back peri care, use of pillows for positioning in bed, building up sitting tolerance, sequence of dressing     Vision Baseline Vision/History: 0 No visual deficits              Pertinent Vitals/Pain Pain Assessment Pain Assessment: 0-10 Pain Score: 8  Pain Location: incisional Pain Descriptors / Indicators: Aching, Grimacing, Guarding, Sore Pain Intervention(s): Limited activity within patient's tolerance, Monitored during session, Repositioned     Extremity/Trunk Assessment Upper Extremity Assessment Upper Extremity Assessment: Overall WFL for tasks assessed              Cognition Arousal: Alert Behavior During Therapy: WFL for tasks assessed/performed Cognition: No apparent impairments                               Following commands: Intact       Cueing    Cueing Techniques: Verbal cues              Home Living Family/patient expects to be discharged  to:: Private residence Living Arrangements: Spouse/significant other;Children;Other relatives Available Help at Discharge: Family;Available PRN/intermittently Type of Home: House             Bathroom Shower/Tub: Walk-in shower;Door   [employer] Toilet: Handicapped height                Prior Functioning/Environment Prior Level of Function : Independent/Modified Independent                    OT Problem List: Decreased range of motion;Pain        OT Goals(Current goals can be found in the care plan section)   Acute Rehab OT Goals Patient Stated Goal: to go home today         AM-PAC OT "6 Clicks" Daily Activity     Outcome Measure Help from another person eating meals?: None Help from another person taking care of personal grooming?: None Help from another person toileting, which includes using toliet,  bedpan, or urinal?: None Help from another person bathing (including washing, rinsing, drying)?: None Help from another person to put on and taking off regular upper body clothing?: None Help from another person to put on and taking off regular lower body clothing?: None 6 Click Score: 24   End of Session Equipment Utilized During Treatment: Rolling walker (2 wheels) Nurse Communication:  (pt needs a RW and shower seat)  Activity Tolerance: Patient tolerated treatment well Patient left: in bed  OT Visit Diagnosis: Muscle weakness (generalized) (M62.81);Pain Pain - part of body:  (incisional)                Time: 4403-4742 OT Time Calculation (min): 14 min Charges:  OT General Charges $OT Visit: 1 Visit OT Evaluation $OT Eval Moderate Complexity: 1 Mod  Cathy L. OT Acute Rehabilitation Services Office [phone]    Lenox Raider 03/17/2024, 10:25 AM

## 2024-03-18 DIAGNOSIS — M4807 Spinal stenosis, lumbosacral region: Secondary | ICD-10-CM | POA: Diagnosis not present

## 2024-03-18 MED ORDER — HYDROCODONE-ACETAMINOPHEN 10-325 MG PO TABS: 1.0000 | ORAL_TABLET | ORAL | 0 refills | Status: DC | PRN

## 2024-03-18 MED ORDER — METHOCARBAMOL 500 MG PO TABS
500.0000 mg | ORAL_TABLET | Freq: Four times a day (QID) | ORAL | 1 refills | Status: DC | PRN
Start: 2024-03-18 — End: 2024-09-10

## 2024-03-18 NOTE — Discharge Instructions (Signed)

## 2024-03-18 NOTE — Discharge Summary (Signed)
 Physician Discharge Summary  Patient ID: Phillip Mcdaniel MRN: [id number] DOB/AGE: 03-22-1971 52 y.o.  Admit date: 03/16/2024 Discharge date: 03/18/2024  Admission Diagnoses:  Discharge Diagnoses:  Principal Problem:   Lumbar foraminal stenosis   Discharged Condition: good  Hospital Course: Patient admitted to the hospital where underwent uncomplicated L5-S1 decompression and fusion.  Postoperatively doing well.  Preoperative back and radicular pain improved.  Standing ambulating and voiding without difficulty.  Ready for discharge home.  Consults:   Significant Diagnostic Studies:   Treatments:   Discharge Exam: Blood pressure 139/73, pulse 95, temperature 99.5 F (37.5 C), temperature source Oral, resp. rate 17, height 5\' 7"  (1.702 m), weight 86.2 kg, SpO2 98%. Awake and alert.  Oriented and appropriate.  Motor and sensory function intact.  Wound clean and dry.  Chest and abdomen benign.  Disposition: Discharge disposition: 01-Home or Self Care        Allergies as of 03/18/2024   No Known Allergies      Medication List     TAKE these medications    acetaminophen  500 MG tablet Commonly known as: TYLENOL  Take 500-1,000 mg by mouth every 6 (six) hours as needed (pain.).   amLODipine  5 MG tablet Commonly known as: NORVASC  Take 1 tablet (5 mg total) by mouth daily.   HYDROcodone -acetaminophen  10-325 MG tablet Commonly known as: NORCO Take 1-2 tablets by mouth every 4 (four) hours as needed for moderate pain (pain score 4-6) or severe pain (pain score 7-10) ((score 4 to 6)).   hydrOXYzine  25 MG tablet Commonly known as: ATARAX  Take 25 mg by mouth every 6 (six) hours.   lisinopril  5 MG tablet Commonly known as: ZESTRIL  Take 1 tablet (5 mg total) by mouth daily.   methocarbamol  500 MG tablet Commonly known as: ROBAXIN  Take 1 tablet (500 mg total) by mouth every 6 (six) hours as needed for muscle spasms.   pregabalin 100 MG capsule Commonly known as:  LYRICA Take 100 mg by mouth 3 (three) times daily.               Durable Medical Equipment  (From admission, onward)           Start     Ordered   03/16/24 1629  DME Walker rolling  Once       Question:  Patient needs a walker to treat with the following condition  Answer:  Lumbar foraminal stenosis   03/16/24 1628   03/16/24 1629  DME 3 n 1  Once        03/16/24 1628             Signed: Awilda Bogus Avant Printy 03/18/2024, 9:16 AM

## 2024-03-18 NOTE — Progress Notes (Signed)
 Physical Therapy Treatment  Patient Details Name: Phillip Mcdaniel MRN: [id number] DOB: 1971-10-14 Today's Date: 03/18/2024   History of Present Illness Pt is a 53 y/o male who presents s/p bilateral L5-S1 decompressive lamintomies and foraminotomies and fusion as well as L5-S1 posterior lateral arthrodesis on 03/16/2024. PMH significant for anxiety, OA, AVN of right hip, chronic neck pain, depression, HTN, anterior cervical sx, right hip replacement.    PT Comments  Pt progressing well with post-op mobility. She was able to demonstrate transfers and ambulation with gross modified independence to supervision for safety and RW for support. Pt reports walking a significant amount yesterday and with increased pain this morning. Reinforced education on precautions, brace application/wearing schedule, appropriate activity progression, and car transfer. Will continue to follow.      If plan is discharge home, recommend the following: A little help with walking and/or transfers;A little help with bathing/dressing/bathroom;Assistance with cooking/housework;Assist for transportation;Help with stairs or ramp for entrance   Can travel by private vehicle        Equipment Recommendations  Rolling walker (2 wheels)    Recommendations for Other Services       Precautions / Restrictions Precautions Precautions: Back;Fall Precaution Booklet Issued: Yes (comment) Recall of Precautions/Restrictions: Intact Precaution/Restrictions Comments: Reviewed handout and pt was cued for precautions during functional mobility. Required Braces or Orthoses: Spinal Brace Spinal Brace: Lumbar corset;Applied in sitting position Restrictions Weight Bearing Restrictions Per Provider Order: No     Mobility  Bed Mobility Overal bed mobility: Modified Independent             General bed mobility comments: HOB flat and rails lowered to simulate home environment.    Transfers Overall transfer level: Modified  independent Equipment used: Rolling walker (2 wheels)               General transfer comment: Pt demonstrated proper hand placement on seated surface for safety. No assist required.    Ambulation/Gait Ambulation/Gait assistance: Supervision Gait Distance (Feet): 500 Feet Assistive device: Rolling walker (2 wheels) Gait Pattern/deviations: Step-through pattern, Decreased stride length, Trunk flexed Gait velocity: Decreased Gait velocity interpretation: 1.31 - 2.62 ft/sec, indicative of limited community ambulator   General Gait Details: VC's for improved posture, closer walker proximity, and forward gaze. No assist required. No overt LOB noted.   Stairs             Wheelchair Mobility     Tilt Bed    Modified Rankin (Stroke Patients Only)       Balance                                            Communication Communication Communication: No apparent difficulties  Cognition Arousal: Alert Behavior During Therapy: WFL for tasks assessed/performed   PT - Cognitive impairments: No apparent impairments                         Following commands: Intact      Cueing Cueing Techniques: Verbal cues  Exercises      General Comments        Pertinent Vitals/Pain Pain Assessment Pain Assessment: Faces Faces Pain Scale: Hurts little more Pain Location: incisional Pain Descriptors / Indicators: Aching, Grimacing, Guarding, Sore Pain Intervention(s): Limited activity within patient's tolerance, Monitored during session, Repositioned    Home Living  Prior Function            PT Goals (current goals can now be found in the care plan section) Acute Rehab PT Goals Patient Stated Goal: Home today PT Goal Formulation: With patient Time For Goal Achievement: 03/24/24 Potential to Achieve Goals: Good Progress towards PT goals: Progressing toward goals    Frequency    Min 5X/week       PT Plan      Co-evaluation              AM-PAC PT "6 Clicks" Mobility   Outcome Measure  Help needed turning from your back to your side while in a flat bed without using bedrails?: None Help needed moving from lying on your back to sitting on the side of a flat bed without using bedrails?: None Help needed moving to and from a bed to a chair (including a wheelchair)?: None Help needed standing up from a chair using your arms (e.g., wheelchair or bedside chair)?: None Help needed to walk in hospital room?: A Little Help needed climbing 3-5 steps with a railing? : A Little 6 Click Score: 22    End of Session Equipment Utilized During Treatment: Gait belt;Back brace Activity Tolerance: Patient tolerated treatment well Patient left: in bed;with call bell/phone within reach Nurse Communication: Mobility status PT Visit Diagnosis: Unsteadiness on feet (R26.81);Pain Pain - part of body:  (back)     Time: 1610-9604 PT Time Calculation (min) (ACUTE ONLY): 18 min  Charges:    $Gait Training: 8-22 mins PT General Charges $$ ACUTE PT VISIT: 1 Visit                     Simone Dubois, PT, DPT Acute Rehabilitation Services Secure Chat Preferred Office: [phone]    Venus Ginsberg 03/18/2024, 10:58 AM

## 2024-03-18 NOTE — Progress Notes (Signed)
 Patient alert and oriented, ambulate, void, VSS. Surgical site clean and dry no sign of infection. D/c instructions explain and given to the patient all questions andwered.

## 2024-03-20 MED FILL — Sodium Chloride IV Soln 0.9%: INTRAVENOUS | Qty: 150 | Status: AC

## 2024-03-20 MED FILL — Heparin Sodium (Porcine) Inj 1000 Unit/ML: INTRAMUSCULAR | Qty: 30 | Status: AC

## 2024-06-13 ENCOUNTER — Other Ambulatory Visit: Payer: Self-pay | Admitting: Family Medicine

## 2024-06-13 DIAGNOSIS — I10 Essential (primary) hypertension: Secondary | ICD-10-CM

## 2024-06-16 NOTE — Telephone Encounter (Signed)
 Spoke to patient he stated that he's having transportation issues will get in with appointment within 30days

## 2024-06-16 NOTE — Telephone Encounter (Signed)
Please call pt to schedule an appt for HTN.  KP

## 2024-06-16 NOTE — Telephone Encounter (Signed)
 Requested medication (s) are due for refill today - yes  Requested medication (s) are on the active medication list -yes  Future visit scheduled -no  Last refill: 03/11/24 #90 both requested  Notes to clinic: new start medication- no follow up appointment  Requested Prescriptions  Pending Prescriptions Disp Refills   lisinopril  (ZESTRIL ) 5 MG tablet [Pharmacy Med Name: LISINOPRIL  5MG  TABLETS] 90 tablet 0    Sig: TAKE 1 TABLET(5 MG) BY MOUTH DAILY     Cardiovascular:  ACE Inhibitors Passed - 06/16/2024  3:25 PM      Passed - Cr in normal range and within 180 days    Creatinine  Date Value Ref Range Status  11/23/2011 1.23 0.60 - 1.30 mg/dL Final   Creatinine, Ser  Date Value Ref Range Status  03/04/2024 1.04 0.61 - 1.24 mg/dL Final         Passed - K in normal range and within 180 days    Potassium  Date Value Ref Range Status  03/04/2024 4.3 3.5 - 5.1 mmol/L Final  11/23/2011 4.0 3.5 - 5.1 mmol/L Final         Passed - Patient is not pregnant      Passed - Last BP in normal range    BP Readings from Last 1 Encounters:  03/18/24 139/73         Passed - Valid encounter within last 6 months    Recent Outpatient Visits           3 months ago Uncontrolled hypertension   [facility] [place], Vinay K, MD               amLODipine  (NORVASC ) 5 MG tablet [Pharmacy Med Name: AMLODIPINE  BESYLATE 5MG  TABLETS] 90 tablet 0    Sig: TAKE 1 TABLET(5 MG) BY MOUTH DAILY     Cardiovascular: Calcium Channel Blockers 2 Passed - 06/16/2024  3:25 PM      Passed - Last BP in normal range    BP Readings from Last 1 Encounters:  03/18/24 139/73         Passed - Last Heart Rate in normal range    Pulse Readings from Last 1 Encounters:  03/18/24 95         Passed - Valid encounter within last 6 months    Recent Outpatient Visits           3 months ago Uncontrolled hypertension   [place] [facility]  [facility], Vinay K, MD                 Requested Prescriptions  Pending Prescriptions Disp Refills   lisinopril  (ZESTRIL ) 5 MG tablet [Pharmacy Med Name: LISINOPRIL  5MG  TABLETS] 90 tablet 0    Sig: TAKE 1 TABLET(5 MG) BY MOUTH DAILY     Cardiovascular:  ACE Inhibitors Passed - 06/16/2024  3:25 PM      Passed - Cr in normal range and within 180 days    Creatinine  Date Value Ref Range Status  11/23/2011 1.23 0.60 - 1.30 mg/dL Final   Creatinine, Ser  Date Value Ref Range Status  03/04/2024 1.04 0.61 - 1.24 mg/dL Final         Passed - K in normal range and within 180 days    Potassium  Date Value Ref Range Status  03/04/2024 4.3 3.5 - 5.1 mmol/L Final  11/23/2011 4.0 3.5 - 5.1 mmol/L Final  Passed - Patient is not pregnant      Passed - Last BP in normal range    BP Readings from Last 1 Encounters:  03/18/24 139/73         Passed - Valid encounter within last 6 months    Recent Outpatient Visits           3 months ago Uncontrolled hypertension   [place] [facility], Vinay K, MD               amLODipine  (NORVASC ) 5 MG tablet [Pharmacy Med Name: AMLODIPINE  BESYLATE 5MG  TABLETS] 90 tablet 0    Sig: TAKE 1 TABLET(5 MG) BY MOUTH DAILY     Cardiovascular: Calcium Channel Blockers 2 Passed - 06/16/2024  3:25 PM      Passed - Last BP in normal range    BP Readings from Last 1 Encounters:  03/18/24 139/73         Passed - Last Heart Rate in normal range    Pulse Readings from Last 1 Encounters:  03/18/24 95         Passed - Valid encounter within last 6 months    Recent Outpatient Visits           3 months ago Uncontrolled hypertension   [facility], Vinay K, MD

## 2024-06-30 ENCOUNTER — Ambulatory Visit: Attending: Neurosurgery

## 2024-06-30 DIAGNOSIS — M5459 Other low back pain: Secondary | ICD-10-CM | POA: Insufficient documentation

## 2024-06-30 NOTE — Therapy (Signed)
 OUTPATIENT PHYSICAL THERAPY THORACOLUMBAR EVALUATION   Patient Name: Phillip Mcdaniel MRN: [id number] DOB:[birth year], 53 y.o., male Today's Date: 06/30/2024  END OF SESSION:  PT End of Session - 06/30/24 1211     Visit Number 1    Number of Visits 13    Date for PT Re-Evaluation 08/11/24    Authorization Type 2x/week x 6 weeks requested    PT Start Time 1120    PT Stop Time 1205    PT Time Calculation (min) 45 min    Activity Tolerance Patient tolerated treatment well          Past Medical History:  Diagnosis Date   Anxiety    Arthritis    AVN (avascular necrosis of bone) (HCC)    RIGHT hip   Chronic neck pain    Depression    ED (erectile dysfunction)    Hepatitis    Hepatitis C s/p treatment wtih Mayvret   Hypertension    Lumbar spondylolysis    Past Surgical History:  Procedure Laterality Date   BACK SURGERY     anterior cervical   HIP SURGERY     RIGHT hip replacement   NECK SURGERY  2024   C3-6   Patient Active Problem List   Diagnosis Date Noted   Lumbar foraminal stenosis 03/16/2024   Carpal tunnel syndrome 03/11/2024   Lumbar spondylosis 03/11/2024   S/P cervical spinal fusion 03/11/2024   Chronic hepatitis C without hepatic coma (HCC) 08/12/2021   Elevated liver enzymes 08/10/2021   Alcohol abuse 08/08/2021   Anxiety 08/08/2021   Chronic neck pain 08/08/2021   Hypertension 08/08/2021   Other male erectile dysfunction 08/08/2021   Tinnitus of both ears 08/08/2021   Vision problems 08/08/2021   Substance-induced psychotic disorder (HCC) 05/06/2020   Polysubstance abuse (HCC) 05/03/2020   Depression 05/03/2020    PCP: Dr. Sol, MD  REFERRING PROVIDER: Dr. Louis, MD  REFERRING DIAG:  Diagnosis  M54.16 (ICD-10-CM) - Radiculopathy, lumbar region    Rationale for Evaluation and Treatment: Rehabilitation  THERAPY DIAG:  Other low back pain  ONSET DATE: May 2025- Lumbar fusion  SUBJECTIVE:                                                                                                                                                                                            SUBJECTIVE STATEMENT: Pt reports having lumbar fusion surgery in May 2025.  He reports the surgery went well.  He does still experience back pain- lower and upper; having tingling in legs/knees, lower back pain; having charlie horses in his legs/cramps in his back (muscle spasms)  Pt is R hand dominant  Pt is not working now  Agg: prolonged standing and walking   Allev: hot tub  PERTINENT HISTORY:  Per chart review: bilateral L5-S1 decompressive lamintomies and foraminotomies and fusion as well as L5-S1 posterior lateral arthrodesis on 03/16/2024.  H/o R THA a few years ago too- due to AVN  Pt reports cervical fusion surgery in Feb 2025(?)- reports having neck pain/UE parasthesias intermittently L>R UE  PAIN:  Are you having pain? Yes 8/10 at worst, constant   PRECAUTIONS: None  RED FLAGS: None   WEIGHT BEARING RESTRICTIONS: No  FALLS:  Has patient fallen in last 6 months? No  LIVING ENVIRONMENT: Lives with: lives with their spouse Lives in: House/apartment Stairs: yes- few steps to enter  Has following equipment at home: none   OCCUPATION: pt is not working; disabled; not working any more; he just got a bass guitar to lean how to play and enjoys listening to music   PLOF: Independent  PATIENT GOALS:   NEXT MD VISIT: 07/30/24  OBJECTIVE:  Note: Objective measures were completed at Evaluation unless otherwise noted.  PATIENT SURVEYS:  mODI: 31/50  COGNITION: Overall cognitive status: Within functional limits for tasks assessed     SENSATION: WFL  MUSCLE LENGTH: (+) hamstring/gluteal mm tightness b/l  POSTURE: decreased lumbar lordosis  PALPATION: Incision site is healed well; normal skin appearance at scar/surrounding soft tissue  LUMBAR ROM:   AROM eval  Flexion 50% limited  Extension 50% limited  Right  lateral flexion 50% limited  Left lateral flexion 50% limited  Right rotation   Left rotation    (Blank rows = not tested)  LOWER EXTREMITY ROM:     Active  Right eval Left eval  Hip flexion 95 100  Hip extension    Hip abduction    Hip adduction    Hip internal rotation    Hip external rotation    Knee flexion    Knee extension    Ankle dorsiflexion    Ankle plantarflexion    Ankle inversion    Ankle eversion     (Blank rows = not tested)  LOWER EXTREMITY MMT:    MMT Right eval Left eval  Hip flexion 4 4+  Hip extension    Hip abduction    Hip adduction    Hip internal rotation    Hip external rotation    Knee flexion 5 5  Knee extension 5 5  Ankle dorsiflexion 5 5  Ankle plantarflexion    Ankle inversion    Ankle eversion     (Blank rows = not tested)   FUNCTIONAL TESTS:  Pt able to transfer sit to supine using transfer through sidelying/log roll technique  SLS without UE support x 5-10 sec R/L  GAIT: Pt able to amb without AD into/out of clinic today; amb with decreased speed; decreased stride length, decreased trunk rotation b/l  TREATMENT DATE: 06/30/24: Initial evaluation performed  PATIENT EDUCATION:  Education details: PT POC/goals Person educated: Patient Education method: Explanation Education comprehension: verbalized understanding  HOME EXERCISE PROGRAM: To be formally initiated at visit #2  ASSESSMENT:  CLINICAL IMPRESSION: Patient is a 53 y.o. M who was seen today for physical therapy evaluation and treatment for low back pain with radicular sx after undergoing bilateral L5-S1 decompressive lamintomies and foraminotomies and fusion as well as L5-S1 posterior lateral arthrodesis on 03/16/2024.   OBJECTIVE IMPAIRMENTS: Abnormal gait, decreased activity tolerance, decreased balance, decreased mobility, decreased  ROM, decreased strength, impaired flexibility, and pain.   ACTIVITY LIMITATIONS: carrying, lifting, bending, standing, squatting, and locomotion level  PARTICIPATION LIMITATIONS: cleaning, shopping, community activity, and yard work  PERSONAL FACTORS: Past/current experiences, Time since onset of injury/illness/exacerbation, and 1-2 comorbidities: s/p lumbar spine fusion, s/p cervical spine fusion, s/p R THA, see other PMH in chart are also affecting patient's functional outcome.   REHAB POTENTIAL: Good  CLINICAL DECISION MAKING: Evolving/moderate complexity  EVALUATION COMPLEXITY: Moderate   GOALS: Goals reviewed with patient? Yes  SHORT TERM GOALS: Target date: 07/10/24  Pt will be instructed on HEP for LE/trunk flexibility and ROM and LE/trunk mm neuromuscular re-ed/strengthening Baseline:at visit #2 Goal status: INITIAL   LONG TERM GOALS: Target date: 08/11/24  Improve mODI >10 points indicating pt significantly less impaired by his LBP during typical daily activities Baseline: 31/50 Goal status: INITIAL  2.  Improve Hip strength b/l 1/2 MMT to facilitate improved standing/walking tolerance to 1 hr for housework/yardwork Baseline: 4/5 hip flex Goal status: INITIAL  3.  Pt will demonstrate ability to squat/lift/carry 20 lbs with neutral spine posture to facilitate being able to perform his daily housework/yardwork  Baseline: to be initiated at future visit Goal status: INITIAL   PLAN:  PT FREQUENCY: 2x/week  PT DURATION: 6 weeks  PLANNED INTERVENTIONS: 97110-Therapeutic exercises, 97530- Therapeutic activity, 97112- Neuromuscular re-education, 97535- Self Care, and [id number]- Manual therapy.  PLAN FOR NEXT SESSION: hamstring stretch, gluteal stretch, trunk mm retraining, initiate HEP  Vernell Reges, PT, DPT, OCS  #[id number]  Vernell FORBES Reges, PT 06/30/2024, 12:11 PM

## 2024-07-01 ENCOUNTER — Encounter

## 2024-07-06 ENCOUNTER — Ambulatory Visit

## 2024-07-06 DIAGNOSIS — M5459 Other low back pain: Secondary | ICD-10-CM

## 2024-07-06 NOTE — Therapy (Signed)
 OUTPATIENT PHYSICAL THERAPY THORACOLUMBAR TREATMENT   Patient Name: Phillip Mcdaniel MRN: [id number] DOB:17-Oct-1971, 53 y.o., male Today's Date: 07/06/2024  END OF SESSION:  PT End of Session - 07/06/24 0858     Visit Number 2    Number of Visits 13    Date for PT Re-Evaluation 08/11/24    Authorization Type 2x/week x 6 weeks requested    PT Start Time 0900    PT Stop Time 0945    PT Time Calculation (min) 45 min    Activity Tolerance Patient tolerated treatment well          Past Medical History:  Diagnosis Date   Anxiety    Arthritis    AVN (avascular necrosis of bone) (HCC)    RIGHT hip   Chronic neck pain    Depression    ED (erectile dysfunction)    Hepatitis    Hepatitis C s/p treatment wtih Mayvret   Hypertension    Lumbar spondylolysis    Past Surgical History:  Procedure Laterality Date   BACK SURGERY     anterior cervical   HIP SURGERY     RIGHT hip replacement   NECK SURGERY  2024   C3-6   Patient Active Problem List   Diagnosis Date Noted   Lumbar foraminal stenosis 03/16/2024   Carpal tunnel syndrome 03/11/2024   Lumbar spondylosis 03/11/2024   S/P cervical spinal fusion 03/11/2024   Chronic hepatitis C without hepatic coma (HCC) 08/12/2021   Elevated liver enzymes 08/10/2021   Alcohol abuse 08/08/2021   Anxiety 08/08/2021   Chronic neck pain 08/08/2021   Hypertension 08/08/2021   Other male erectile dysfunction 08/08/2021   Tinnitus of both ears 08/08/2021   Vision problems 08/08/2021   Substance-induced psychotic disorder (HCC) 05/06/2020   Polysubstance abuse (HCC) 05/03/2020   Depression 05/03/2020    PCP: Dr. Sol, MD  REFERRING PROVIDER: Dr. Louis, MD  REFERRING DIAG:  Diagnosis  M54.16 (ICD-10-CM) - Radiculopathy, lumbar region    Rationale for Evaluation and Treatment: Rehabilitation  THERAPY DIAG:  Other low back pain  ONSET DATE: May 2025- Lumbar fusion  SUBJECTIVE:                                                                                                                                                                                            SUBJECTIVE STATEMENT: Pt reports having lumbar fusion surgery in May 2025.  He reports the surgery went well.  He does still experience back pain- lower and upper; having tingling in legs/knees, lower back pain; having charlie horses in his legs/cramps in his back (muscle spasms)  Pt is R hand dominant  Pt is not working now  Agg: prolonged standing and walking   Allev: hot tub  PERTINENT HISTORY:  Per chart review: bilateral L5-S1 decompressive lamintomies and foraminotomies and fusion as well as L5-S1 posterior lateral arthrodesis on 03/16/2024.  H/o R THA a few years ago too- due to AVN  Pt reports cervical fusion surgery in Feb 2025(?)- reports having neck pain/UE parasthesias intermittently L>R UE  PAIN:  Are you having pain? Yes 8/10 at worst, constant   PRECAUTIONS: None  RED FLAGS: None   WEIGHT BEARING RESTRICTIONS: No  FALLS:  Has patient fallen in last 6 months? No  LIVING ENVIRONMENT: Lives with: lives with their spouse Lives in: House/apartment Stairs: yes- few steps to enter  Has following equipment at home: none   OCCUPATION: pt is not working; disabled; not working any more; he just got a bass guitar to lean how to play and enjoys listening to music   PLOF: Independent  PATIENT GOALS:   NEXT MD VISIT: 07/30/24  OBJECTIVE:  Note: Objective measures were completed at Evaluation unless otherwise noted.  PATIENT SURVEYS:  mODI: 31/50  COGNITION: Overall cognitive status: Within functional limits for tasks assessed     SENSATION: WFL  MUSCLE LENGTH: (+) hamstring/gluteal mm tightness b/l  POSTURE: decreased lumbar lordosis  PALPATION: Incision site is healed well; normal skin appearance at scar/surrounding soft tissue  LUMBAR ROM:   AROM eval  Flexion 50% limited  Extension 50% limited  Right  lateral flexion 50% limited  Left lateral flexion 50% limited  Right rotation   Left rotation    (Blank rows = not tested)  LOWER EXTREMITY ROM:     Active  Right eval Left eval  Hip flexion 95 100  Hip extension    Hip abduction    Hip adduction    Hip internal rotation    Hip external rotation    Knee flexion    Knee extension    Ankle dorsiflexion    Ankle plantarflexion    Ankle inversion    Ankle eversion     (Blank rows = not tested)  LOWER EXTREMITY MMT:    MMT Right eval Left eval  Hip flexion 4 4+  Hip extension    Hip abduction    Hip adduction    Hip internal rotation    Hip external rotation    Knee flexion 5 5  Knee extension 5 5  Ankle dorsiflexion 5 5  Ankle plantarflexion    Ankle inversion    Ankle eversion     (Blank rows = not tested)   FUNCTIONAL TESTS:  Pt able to transfer sit to supine using transfer through sidelying/log roll technique  SLS without UE support x 5-10 sec R/L  GAIT: Pt able to amb without AD into/out of clinic today; amb with decreased speed; decreased stride length, decreased trunk rotation b/l  TREATMENT DATE: 07/06/24 Subjective: Pt reports his back is sore/stiff after riding in the car to/from [facility] this weekend.  Feels like a tight knot in his back muscles.  Objective: Manual Therapy: Sidelying STM lumbar paraspinal mm, manual QL stretch Manual hamstring stretch R/L: x 1 min ea  Therapeutic Exercises:  Supine hooklying LTR x10 ea SKTC x 1 min ea Sidelying shoulder abd x 3 with diaphragmatic breathing x 2 ea rep, R and L Sidelying open book rotation stretch x 5 R and L Standing calf stretch: x 1 min ea Standing counter top L stretch with  hands on chair x 5  HEP instruction- see below                                                                                                            PATIENT EDUCATION:  Education details: PT POC/goals, HEP instruction Person educated: Patient Education method:  Explanation Education comprehension: verbalized understanding  HOME EXERCISE PROGRAM: Access Code: [email or web address] URL: [email or web address] Date: 07/06/2024 Prepared by: Vernell Reges  Exercises - Sidelying Thoracic Rotation with Open Book  - 1 x daily - 7 x weekly - 1 sets - 10 reps - Sidelying Shoulder Abduction  - 1 x daily - 7 x weekly - 1 sets - 10 reps - Supine Lower Trunk Rotation  - 1 x daily - 7 x weekly - 1 sets - 10 reps - Standing Gastroc Stretch  - 1 x daily - 7 x weekly - 3 sets - 30 seconds hold - Standing 'L' Stretch at Counter  - 1 x daily - 7 x weekly - 1 sets - 10 reps  ASSESSMENT:  CLINICAL IMPRESSION: Patient is a 53 y.o. M who was seen today for physical therapy treatment for low back pain with radicular sx after undergoing bilateral L5-S1 decompressive lamintomies and foraminotomies and fusion as well as L5-S1 posterior lateral arthrodesis on 03/16/2024.  Able to begin manual therapy and therapeutic exercises today to address decreased mobility.  Pt tolerated exercises well, and demonstrates ability to perform as his HEP between today and next session.  OBJECTIVE IMPAIRMENTS: Abnormal gait, decreased activity tolerance, decreased balance, decreased mobility, decreased ROM, decreased strength, impaired flexibility, and pain.   ACTIVITY LIMITATIONS: carrying, lifting, bending, standing, squatting, and locomotion level  PARTICIPATION LIMITATIONS: cleaning, shopping, community activity, and yard work  PERSONAL FACTORS: Past/current experiences, Time since onset of injury/illness/exacerbation, and 1-2 comorbidities: s/p lumbar spine fusion, s/p cervical spine fusion, s/p R THA, see other PMH in chart are also affecting patient's functional outcome.   REHAB POTENTIAL: Good  CLINICAL DECISION MAKING: Evolving/moderate complexity  EVALUATION COMPLEXITY: Moderate   GOALS: Goals reviewed with patient? Yes  SHORT TERM GOALS: Target date: 07/10/24  Pt will  be instructed on HEP for LE/trunk flexibility and ROM and LE/trunk mm neuromuscular re-ed/strengthening Baseline:at visit #2 Goal status: INITIAL   LONG TERM GOALS: Target date: 08/11/24  Improve mODI >10 points indicating pt significantly less impaired by his LBP during typical daily activities Baseline: 31/50 Goal status: INITIAL  2.  Improve Hip strength b/l 1/2 MMT to facilitate improved standing/walking tolerance to 1 hr for housework/yardwork Baseline: 4/5 hip flex Goal status: INITIAL  3.  Pt will demonstrate ability to squat/lift/carry 20 lbs with neutral spine posture to facilitate being able to perform his daily housework/yardwork  Baseline: to be initiated at future visit Goal status: INITIAL   PLAN:  PT FREQUENCY: 2x/week  PT DURATION: 6 weeks  PLANNED INTERVENTIONS: 97110-Therapeutic exercises, 97530- Therapeutic activity, 97112- Neuromuscular re-education, 97535- Self Care, and [id number]- Manual therapy.  PLAN FOR NEXT SESSION: hamstring stretch, gluteal stretch, trunk mm retraining, initiate HEP  Vernell Reges, PT, DPT, OCS  Sameer Teeple E Kynli Chou, PT 07/06/2024, 9:00 AM

## 2024-07-08 ENCOUNTER — Ambulatory Visit

## 2024-07-08 ENCOUNTER — Encounter

## 2024-07-08 DIAGNOSIS — M5459 Other low back pain: Secondary | ICD-10-CM

## 2024-07-08 NOTE — Therapy (Signed)
 OUTPATIENT PHYSICAL THERAPY THORACOLUMBAR TREATMENT   Patient Name: Phillip Mcdaniel MRN: [id number] DOB:10-Aug-1971, 53 y.o., male Today's Date: 07/08/2024  END OF SESSION:  PT End of Session - 07/08/24 0947     Visit Number 3    Number of Visits 13    Date for PT Re-Evaluation 08/11/24    Authorization Type 2x/week x 6 weeks requested    PT Start Time 0947    PT Stop Time 1030    PT Time Calculation (min) 43 min    Activity Tolerance Patient tolerated treatment well    Behavior During Therapy WFL for tasks assessed/performed          Past Medical History:  Diagnosis Date   Anxiety    Arthritis    AVN (avascular necrosis of bone) (HCC)    RIGHT hip   Chronic neck pain    Depression    ED (erectile dysfunction)    Hepatitis    Hepatitis C s/p treatment wtih Mayvret   Hypertension    Lumbar spondylolysis    Past Surgical History:  Procedure Laterality Date   BACK SURGERY     anterior cervical   HIP SURGERY     RIGHT hip replacement   NECK SURGERY  2024   C3-6   Patient Active Problem List   Diagnosis Date Noted   Lumbar foraminal stenosis 03/16/2024   Carpal tunnel syndrome 03/11/2024   Lumbar spondylosis 03/11/2024   S/P cervical spinal fusion 03/11/2024   Chronic hepatitis C without hepatic coma (HCC) 08/12/2021   Elevated liver enzymes 08/10/2021   Alcohol abuse 08/08/2021   Anxiety 08/08/2021   Chronic neck pain 08/08/2021   Hypertension 08/08/2021   Other male erectile dysfunction 08/08/2021   Tinnitus of both ears 08/08/2021   Vision problems 08/08/2021   Substance-induced psychotic disorder (HCC) 05/06/2020   Polysubstance abuse (HCC) 05/03/2020   Depression 05/03/2020    PCP: Dr. Sol, MD  REFERRING PROVIDER: Dr. Louis, MD  REFERRING DIAG:  Diagnosis  M54.16 (ICD-10-CM) - Radiculopathy, lumbar region    Rationale for Evaluation and Treatment: Rehabilitation  THERAPY DIAG:  Other low back pain  ONSET DATE: May 2025- Lumbar  fusion  SUBJECTIVE:                                                                                                                                                                                           SUBJECTIVE STATEMENT: Pt reports having lumbar fusion surgery in May 2025.  He reports the surgery went well.  He does still experience back pain- lower and upper; having tingling in legs/knees, lower back pain; having charlie  horses in his legs/cramps in his back (muscle spasms)  Pt is R hand dominant  Pt is not working now  Agg: prolonged standing and walking   Allev: hot tub  PERTINENT HISTORY:  Per chart review: bilateral L5-S1 decompressive lamintomies and foraminotomies and fusion as well as L5-S1 posterior lateral arthrodesis on 03/16/2024.  H/o R THA a few years ago too- due to AVN  Pt reports cervical fusion surgery in Feb 2025(?)- reports having neck pain/UE parasthesias intermittently L>R UE  PAIN:  Are you having pain? Yes 8/10 at worst, constant   PRECAUTIONS: None  RED FLAGS: None   WEIGHT BEARING RESTRICTIONS: No  FALLS:  Has patient fallen in last 6 months? No  LIVING ENVIRONMENT: Lives with: lives with their spouse Lives in: House/apartment Stairs: yes- few steps to enter  Has following equipment at home: none   OCCUPATION: pt is not working; disabled; not working any more; he just got a bass guitar to lean how to play and enjoys listening to music   PLOF: Independent  PATIENT GOALS:   NEXT MD VISIT: 07/30/24  OBJECTIVE:  Note: Objective measures were completed at Evaluation unless otherwise noted.  PATIENT SURVEYS:  mODI: 31/50  COGNITION: Overall cognitive status: Within functional limits for tasks assessed     SENSATION: WFL  MUSCLE LENGTH: (+) hamstring/gluteal mm tightness b/l  POSTURE: decreased lumbar lordosis  PALPATION: Incision site is healed well; normal skin appearance at scar/surrounding soft tissue  LUMBAR ROM:    AROM eval  Flexion 50% limited  Extension 50% limited  Right lateral flexion 50% limited  Left lateral flexion 50% limited  Right rotation   Left rotation    (Blank rows = not tested)  LOWER EXTREMITY ROM:     Active  Right eval Left eval  Hip flexion 95 100  Hip extension    Hip abduction    Hip adduction    Hip internal rotation    Hip external rotation    Knee flexion    Knee extension    Ankle dorsiflexion    Ankle plantarflexion    Ankle inversion    Ankle eversion     (Blank rows = not tested)  LOWER EXTREMITY MMT:    MMT Right eval Left eval  Hip flexion 4 4+  Hip extension    Hip abduction    Hip adduction    Hip internal rotation    Hip external rotation    Knee flexion 5 5  Knee extension 5 5  Ankle dorsiflexion 5 5  Ankle plantarflexion    Ankle inversion    Ankle eversion     (Blank rows = not tested)   FUNCTIONAL TESTS:  Pt able to transfer sit to supine using transfer through sidelying/log roll technique  SLS without UE support x 5-10 sec R/L  GAIT: Pt able to amb without AD into/out of clinic today; amb with decreased speed; decreased stride length, decreased trunk rotation b/l  TREATMENT DATE: 07/08/24 Subjective: Pt reports overall feeling about the same; no flare up of sx after PT.  Tried his HEP.  Overall, he is trying to do a little more activity.    Objective: Manual Therapy: Sidelying STM lumbar paraspinal mm (with TPR and hypervolt), manual QL stretch Manual hamstring stretch R/L: x 1 min ea  Therapeutic Exercises:  Supine hooklying LTR x10 ea SKTC x 1 min ea Sidelying shoulder abd x 3 with diaphragmatic breathing x 2 ea rep, R and L Sidelying open book  rotation stretch x 5 R and L- not today Standing calf stretch: x 1 min ea- not today Standing counter top L stretch with hands on chair x 5  Therapeutic Activities: Standing abdominal brace with pt tactile cues on abdomen x 10 Abdominal brace + squat x 10, squat hold  x 10 (5 second holds) Abdominal brace with pull/scapular retraction in standing: blue TB 3x10 Abdominal brace with push in standing: blue TB 3x10 Attempted pallof press but too painful for pt's hands today  HEP instruction- see below- continue previous HEP; added abdominal brace + squat to HEP throughout his day                                                                                                            PATIENT EDUCATION:  Education details: PT POC/goals, HEP instruction Person educated: Patient Education method: Explanation Education comprehension: verbalized understanding  HOME EXERCISE PROGRAM: Access Code: [email or web address] URL: [email or web address] Date: 07/06/2024 Prepared by: Vernell Reges  Exercises - Sidelying Thoracic Rotation with Open Book  - 1 x daily - 7 x weekly - 1 sets - 10 reps - Sidelying Shoulder Abduction  - 1 x daily - 7 x weekly - 1 sets - 10 reps - Supine Lower Trunk Rotation  - 1 x daily - 7 x weekly - 1 sets - 10 reps - Standing Gastroc Stretch  - 1 x daily - 7 x weekly - 3 sets - 30 seconds hold - Standing 'L' Stretch at [employer]  - 1 x daily - 7 x weekly - 1 sets - 10 reps  ASSESSMENT:  CLINICAL IMPRESSION: Patient arrived to PT motivated to participate in tx for low back pain with radicular sx after undergoing bilateral L5-S1 decompressive lamintomies and foraminotomies and fusion as well as L5-S1 posterior lateral arthrodesis on 03/16/2024. Continued with  manual therapy and therapeutic exercises today to address decreased mobility.  Began functional core mm retraining, mainly in standing positions today.  Able to demonstrate good squat technique with neutral spine and glute/quad mm activation using primarily hip flexion pattern.  Pt should continue to benefit from skilled PT to address impairments below and facilitate improved QOL and decreased pain.  OBJECTIVE IMPAIRMENTS: Abnormal gait, decreased activity tolerance, decreased  balance, decreased mobility, decreased ROM, decreased strength, impaired flexibility, and pain.   ACTIVITY LIMITATIONS: carrying, lifting, bending, standing, squatting, and locomotion level  PARTICIPATION LIMITATIONS: cleaning, shopping, community activity, and yard work  PERSONAL FACTORS: Past/current experiences, Time since onset of injury/illness/exacerbation, and 1-2 comorbidities: s/p lumbar spine fusion, s/p cervical spine fusion, s/p R THA, see other PMH in chart are also affecting patient's functional outcome.   REHAB POTENTIAL: Good  CLINICAL DECISION MAKING: Evolving/moderate complexity  EVALUATION COMPLEXITY: Moderate   GOALS: Goals reviewed with patient? Yes  SHORT TERM GOALS: Target date: 07/10/24  Pt will be instructed on HEP for LE/trunk flexibility and ROM and LE/trunk mm neuromuscular re-ed/strengthening Baseline:at visit #2 Goal status: INITIAL   LONG TERM GOALS: Target date: 08/11/24  Improve mODI >10 points indicating pt significantly less  impaired by his LBP during typical daily activities Baseline: 31/50 Goal status: INITIAL  2.  Improve Hip strength b/l 1/2 MMT to facilitate improved standing/walking tolerance to 1 hr for housework/yardwork Baseline: 4/5 hip flex Goal status: INITIAL  3.  Pt will demonstrate ability to squat/lift/carry 20 lbs with neutral spine posture to facilitate being able to perform his daily housework/yardwork  Baseline: to be initiated at future visit Goal status: INITIAL   PLAN:  PT FREQUENCY: 2x/week  PT DURATION: 6 weeks  PLANNED INTERVENTIONS: 97110-Therapeutic exercises, 97530- Therapeutic activity, 97112- Neuromuscular re-education, 97535- Self Care, and [id number]- Manual therapy.  PLAN FOR NEXT SESSION: continue trunk mm retraining, attempt pallof press (with nautius for handle instead of TB) again at next visit  Vernell Reges, PT, DPT, OCS  Vernell FORBES Reges, PT 07/08/2024, 9:47 AM

## 2024-07-10 ENCOUNTER — Encounter

## 2024-07-13 ENCOUNTER — Ambulatory Visit

## 2024-07-13 DIAGNOSIS — M5459 Other low back pain: Secondary | ICD-10-CM

## 2024-07-13 NOTE — Therapy (Signed)
 OUTPATIENT PHYSICAL THERAPY THORACOLUMBAR TREATMENT   Patient Name: Phillip Mcdaniel MRN: [id number] DOB:1971-02-17, 53 y.o., male Today's Date: 07/13/2024  END OF SESSION:  PT End of Session - 07/13/24 1205     Visit Number 4    Number of Visits 13    Date for PT Re-Evaluation 08/11/24    Authorization Type 2x/week x 6 weeks requested    PT Start Time 1205    PT Stop Time 1245    PT Time Calculation (min) 40 min    Activity Tolerance Patient tolerated treatment well    Behavior During Therapy WFL for tasks assessed/performed          Past Medical History:  Diagnosis Date   Anxiety    Arthritis    AVN (avascular necrosis of bone) (HCC)    RIGHT hip   Chronic neck pain    Depression    ED (erectile dysfunction)    Hepatitis    Hepatitis C s/p treatment wtih Mayvret   Hypertension    Lumbar spondylolysis    Past Surgical History:  Procedure Laterality Date   BACK SURGERY     anterior cervical   HIP SURGERY     RIGHT hip replacement   NECK SURGERY  2024   C3-6   Patient Active Problem List   Diagnosis Date Noted   Lumbar foraminal stenosis 03/16/2024   Carpal tunnel syndrome 03/11/2024   Lumbar spondylosis 03/11/2024   S/P cervical spinal fusion 03/11/2024   Chronic hepatitis C without hepatic coma (HCC) 08/12/2021   Elevated liver enzymes 08/10/2021   Alcohol abuse 08/08/2021   Anxiety 08/08/2021   Chronic neck pain 08/08/2021   Hypertension 08/08/2021   Other male erectile dysfunction 08/08/2021   Tinnitus of both ears 08/08/2021   Vision problems 08/08/2021   Substance-induced psychotic disorder (HCC) 05/06/2020   Polysubstance abuse (HCC) 05/03/2020   Depression 05/03/2020    PCP: Dr. Sol, MD  REFERRING PROVIDER: Dr. Louis, MD  REFERRING DIAG:  Diagnosis  M54.16 (ICD-10-CM) - Radiculopathy, lumbar region    Rationale for Evaluation and Treatment: Rehabilitation  THERAPY DIAG:  Other low back pain  ONSET DATE: May 2025- Lumbar  fusion  SUBJECTIVE:                                                                                                                                                                                           SUBJECTIVE STATEMENT: Pt reports having lumbar fusion surgery in May 2025.  He reports the surgery went well.  He does still experience back pain- lower and upper; having tingling in legs/knees, lower back pain; having charlie  horses in his legs/cramps in his back (muscle spasms)  Pt is R hand dominant  Pt is not working now  Agg: prolonged standing and walking   Allev: hot tub  PERTINENT HISTORY:  Per chart review: bilateral L5-S1 decompressive lamintomies and foraminotomies and fusion as well as L5-S1 posterior lateral arthrodesis on 03/16/2024.  H/o R THA a few years ago too- due to AVN  Pt reports cervical fusion surgery in Feb 2025(?)- reports having neck pain/UE parasthesias intermittently L>R UE  PAIN:  Are you having pain? Yes 8/10 at worst, constant   PRECAUTIONS: None  RED FLAGS: None   WEIGHT BEARING RESTRICTIONS: No  FALLS:  Has patient fallen in last 6 months? No  LIVING ENVIRONMENT: Lives with: lives with their spouse Lives in: House/apartment Stairs: yes- few steps to enter  Has following equipment at home: none   OCCUPATION: pt is not working; disabled; not working any more; he just got a bass guitar to lean how to play and enjoys listening to music   PLOF: Independent  PATIENT GOALS:   NEXT MD VISIT: 07/30/24  OBJECTIVE:  Note: Objective measures were completed at Evaluation unless otherwise noted.  PATIENT SURVEYS:  mODI: 31/50  COGNITION: Overall cognitive status: Within functional limits for tasks assessed     SENSATION: WFL  MUSCLE LENGTH: (+) hamstring/gluteal mm tightness b/l  POSTURE: decreased lumbar lordosis  PALPATION: Incision site is healed well; normal skin appearance at scar/surrounding soft tissue  LUMBAR ROM:    AROM eval  Flexion 50% limited  Extension 50% limited  Right lateral flexion 50% limited  Left lateral flexion 50% limited  Right rotation   Left rotation    (Blank rows = not tested)  LOWER EXTREMITY ROM:     Active  Right eval Left eval  Hip flexion 95 100  Hip extension    Hip abduction    Hip adduction    Hip internal rotation    Hip external rotation    Knee flexion    Knee extension    Ankle dorsiflexion    Ankle plantarflexion    Ankle inversion    Ankle eversion     (Blank rows = not tested)  LOWER EXTREMITY MMT:    MMT Right eval Left eval  Hip flexion 4 4+  Hip extension    Hip abduction    Hip adduction    Hip internal rotation    Hip external rotation    Knee flexion 5 5  Knee extension 5 5  Ankle dorsiflexion 5 5  Ankle plantarflexion    Ankle inversion    Ankle eversion     (Blank rows = not tested)   FUNCTIONAL TESTS:  Pt able to transfer sit to supine using transfer through sidelying/log roll technique  SLS without UE support x 5-10 sec R/L  GAIT: Pt able to amb without AD into/out of clinic today; amb with decreased speed; decreased stride length, decreased trunk rotation b/l  TREATMENT DATE: 07/13/24 Subjective: Pt reports no new concerns upon arrival today.  Overall, he is trying to do a little more activity at home.    Objective: Manual Therapy: Sidelying STM lumbar paraspinal mm (with TPR and hypervolt), manual QL stretch Manual hamstring stretch R/L: x 1 min ea  Therapeutic Exercises:  Nustep x .25 miles at level 3-4 Supine hooklying LTR x10 ea SKTC x 1 min ea Sidelying shoulder abd x 3 with diaphragmatic breathing x 2 ea rep, R and L Sidelying open book rotation  stretch x 5 R and L Standing calf stretch: x 1 min ea- not today Standing counter top L stretch with hands on chair x 5   Therapeutic Activities: Standing abdominal brace with pt tactile cues on abdomen x 10 Abdominal brace + squat x 10, squat hold x 10 (5  second holds) Abdominal brace with pull/scapular retraction in standing: 40# nautilus Abdominal brace with push in standing: 40 # nautilus  Pallof press: 30 lbs nautilus 2x10 ea direction  HEP instruction- see below- continue previous HEP; added abdominal brace + squat to HEP throughout his day                                                                                                            PATIENT EDUCATION:  Education details: PT POC/goals, HEP instruction Person educated: Patient Education method: Explanation Education comprehension: verbalized understanding  HOME EXERCISE PROGRAM: Access Code: [email or web address] URL: [email or web address] Date: 07/06/2024 Prepared by: Vernell Reges  Exercises - Sidelying Thoracic Rotation with Open Book  - 1 x daily - 7 x weekly - 1 sets - 10 reps - Sidelying Shoulder Abduction  - 1 x daily - 7 x weekly - 1 sets - 10 reps - Supine Lower Trunk Rotation  - 1 x daily - 7 x weekly - 1 sets - 10 reps - Standing Gastroc Stretch  - 1 x daily - 7 x weekly - 3 sets - 30 seconds hold - Standing 'L' Stretch at [employer]  - 1 x daily - 7 x weekly - 1 sets - 10 reps  ASSESSMENT:  CLINICAL IMPRESSION: Patient arrived to PT motivated to participate in tx for low back pain with radicular sx after undergoing bilateral L5-S1 decompressive lamintomies and foraminotomies and fusion as well as L5-S1 posterior lateral arthrodesis on 03/16/2024. Continued with manual therapy and therapeutic exercises today to address decreased mobility.  Continued working on trunk/lumbopelvic mm retraining in neutral spine position. Progressed to using nautilus today to functionally work on exercises in push, pull type movement patterns.  Pt should continue to benefit from skilled PT to address impairments below and facilitate improved QOL and decreased pain.  OBJECTIVE IMPAIRMENTS: Abnormal gait, decreased activity tolerance, decreased balance, decreased mobility, decreased  ROM, decreased strength, impaired flexibility, and pain.   ACTIVITY LIMITATIONS: carrying, lifting, bending, standing, squatting, and locomotion level  PARTICIPATION LIMITATIONS: cleaning, shopping, community activity, and yard work  PERSONAL FACTORS: Past/current experiences, Time since onset of injury/illness/exacerbation, and 1-2 comorbidities: s/p lumbar spine fusion, s/p cervical spine fusion, s/p R THA, see other PMH in chart are also affecting patient's functional outcome.   REHAB POTENTIAL: Good  CLINICAL DECISION MAKING: Evolving/moderate complexity  EVALUATION COMPLEXITY: Moderate   GOALS: Goals reviewed with patient? Yes  SHORT TERM GOALS: Target date: 07/10/24  Pt will be instructed on HEP for LE/trunk flexibility and ROM and LE/trunk mm neuromuscular re-ed/strengthening Baseline:at visit #2 Goal status: INITIAL   LONG TERM GOALS: Target date: 08/11/24  Improve mODI >10 points indicating pt significantly less impaired by his LBP during typical daily activities  Baseline: 31/50 Goal status: INITIAL  2.  Improve Hip strength b/l 1/2 MMT to facilitate improved standing/walking tolerance to 1 hr for housework/yardwork Baseline: 4/5 hip flex Goal status: INITIAL  3.  Pt will demonstrate ability to squat/lift/carry 20 lbs with neutral spine posture to facilitate being able to perform his daily housework/yardwork  Baseline: to be initiated at future visit Goal status: INITIAL   PLAN:  PT FREQUENCY: 2x/week  PT DURATION: 6 weeks  PLANNED INTERVENTIONS: 97110-Therapeutic exercises, 97530- Therapeutic activity, 97112- Neuromuscular re-education, 97535- Self Care, and [id number]- Manual therapy.  PLAN FOR NEXT SESSION: continue trunk mm retraining, attempt pallof press (with nautius for handle instead of TB) again at next visit  Vernell Reges, PT, DPT, OCS  Vernell FORBES Reges, PT 07/13/2024, 12:06 PM

## 2024-07-15 ENCOUNTER — Ambulatory Visit

## 2024-07-15 DIAGNOSIS — M5459 Other low back pain: Secondary | ICD-10-CM

## 2024-07-15 NOTE — Therapy (Signed)
 OUTPATIENT PHYSICAL THERAPY THORACOLUMBAR TREATMENT   Patient Name: Phillip Mcdaniel MRN: [id number] DOB:[birth year], 53 y.o., male Today's Date: 07/15/2024  END OF SESSION:  PT End of Session - 07/15/24 1200     Visit Number 5    Number of Visits 13    Date for PT Re-Evaluation 08/11/24    Authorization Type 2x/week x 6 weeks requested    PT Start Time 1202    PT Stop Time 1245    PT Time Calculation (min) 43 min    Activity Tolerance Patient tolerated treatment well    Behavior During Therapy WFL for tasks assessed/performed          Past Medical History:  Diagnosis Date   Anxiety    Arthritis    AVN (avascular necrosis of bone) (HCC)    RIGHT hip   Chronic neck pain    Depression    ED (erectile dysfunction)    Hepatitis    Hepatitis C s/p treatment wtih Mayvret   Hypertension    Lumbar spondylolysis    Past Surgical History:  Procedure Laterality Date   BACK SURGERY     anterior cervical   HIP SURGERY     RIGHT hip replacement   NECK SURGERY  2024   C3-6   Patient Active Problem List   Diagnosis Date Noted   Lumbar foraminal stenosis 03/16/2024   Carpal tunnel syndrome 03/11/2024   Lumbar spondylosis 03/11/2024   S/P cervical spinal fusion 03/11/2024   Chronic hepatitis C without hepatic coma (HCC) 08/12/2021   Elevated liver enzymes 08/10/2021   Alcohol abuse 08/08/2021   Anxiety 08/08/2021   Chronic neck pain 08/08/2021   Hypertension 08/08/2021   Other male erectile dysfunction 08/08/2021   Tinnitus of both ears 08/08/2021   Vision problems 08/08/2021   Substance-induced psychotic disorder (HCC) 05/06/2020   Polysubstance abuse (HCC) 05/03/2020   Depression 05/03/2020    PCP: Dr. Sol, MD  REFERRING PROVIDER: Dr. Louis, MD  REFERRING DIAG:  Diagnosis  M54.16 (ICD-10-CM) - Radiculopathy, lumbar region    Rationale for Evaluation and Treatment: Rehabilitation  THERAPY DIAG:  Other low back pain  ONSET DATE: May 2025- Lumbar  fusion  SUBJECTIVE:                                                                                                                                                                                           SUBJECTIVE STATEMENT: Pt reports having lumbar fusion surgery in May 2025.  He reports the surgery went well.  He does still experience back pain- lower and upper; having tingling in legs/knees, lower back pain; having charlie  horses in his legs/cramps in his back (muscle spasms)  Pt is R hand dominant  Pt is not working now  Agg: prolonged standing and walking   Allev: hot tub  PERTINENT HISTORY:  Per chart review: bilateral L5-S1 decompressive lamintomies and foraminotomies and fusion as well as L5-S1 posterior lateral arthrodesis on 03/16/2024.  H/o R THA a few years ago too- due to AVN  Pt reports cervical fusion surgery in Feb 2025(?)- reports having neck pain/UE parasthesias intermittently L>R UE  PAIN:  Are you having pain? Yes 8/10 at worst, constant   PRECAUTIONS: None  RED FLAGS: None   WEIGHT BEARING RESTRICTIONS: No  FALLS:  Has patient fallen in last 6 months? No  LIVING ENVIRONMENT: Lives with: lives with their spouse Lives in: House/apartment Stairs: yes- few steps to enter  Has following equipment at home: none   OCCUPATION: pt is not working; disabled; not working any more; he just got a bass guitar to lean how to play and enjoys listening to music   PLOF: Independent  PATIENT GOALS:   NEXT MD VISIT: 07/30/24  OBJECTIVE:  Note: Objective measures were completed at Evaluation unless otherwise noted.  PATIENT SURVEYS:  mODI: 31/50  COGNITION: Overall cognitive status: Within functional limits for tasks assessed     SENSATION: WFL  MUSCLE LENGTH: (+) hamstring/gluteal mm tightness b/l  POSTURE: decreased lumbar lordosis  PALPATION: Incision site is healed well; normal skin appearance at scar/surrounding soft tissue  LUMBAR ROM:    AROM eval  Flexion 50% limited  Extension 50% limited  Right lateral flexion 50% limited  Left lateral flexion 50% limited  Right rotation   Left rotation    (Blank rows = not tested)  LOWER EXTREMITY ROM:     Active  Right eval Left eval  Hip flexion 95 100  Hip extension    Hip abduction    Hip adduction    Hip internal rotation    Hip external rotation    Knee flexion    Knee extension    Ankle dorsiflexion    Ankle plantarflexion    Ankle inversion    Ankle eversion     (Blank rows = not tested)  LOWER EXTREMITY MMT:    MMT Right eval Left eval  Hip flexion 4 4+  Hip extension    Hip abduction    Hip adduction    Hip internal rotation    Hip external rotation    Knee flexion 5 5  Knee extension 5 5  Ankle dorsiflexion 5 5  Ankle plantarflexion    Ankle inversion    Ankle eversion     (Blank rows = not tested)   FUNCTIONAL TESTS:  Pt able to transfer sit to supine using transfer through sidelying/log roll technique  SLS without UE support x 5-10 sec R/L   GAIT: Pt able to amb without AD into/out of clinic today; amb with decreased speed; decreased stride length, decreased trunk rotation b/l  TREATMENT DATE: 07/15/24 Subjective: Pt reports no new concerns upon arrival today.  Overall, he is trying to do a little more activity at home.  No new complaints since last session.    Objective: Manual Therapy: not today Sidelying STM lumbar paraspinal mm (with TPR and hypervolt), manual QL stretch Manual hamstring stretch R/L: x 1 min ea   Therapeutic Exercises:  Nustep x .45 miles at level 3-4 today ( 2/10th mile longer than last session) Supine hooklying LTR x10 ea SKTC x 1 min ea  Sidelying shoulder abd x 3 with diaphragmatic breathing x 2 ea rep, R and L- not today Sidelying open book rotation stretch x 5 R and L- not today Standing calf stretch: x 1 min ea- not today Standing counter top L stretch with hands on chair x 5   Therapeutic  Activities: Standing abdominal brace with pt tactile cues on abdomen x 10 Abdominal brace + squat x 10, 4 sets holding 10 lb med ball Abdominal brace with pull/scapular retraction in standing: 40# nautilus Abdominal brace with push in standing: 40 # nautilus  Pallof press: 30 lbs nautilus 2x10 ea direction- not today TA bracing on physioball: with marches x 20, with knee ext x 20 TA brace with bridge x 10 (add squeeze), x15 (hip abd with blue band)   HEP instruction- see below- continue previous HEP; added abdominal brace + squat to HEP throughout his day                                                            PATIENT EDUCATION:  Education details: PT POC/goals, HEP instruction Person educated: Patient Education method: Explanation Education comprehension: verbalized understanding  HOME EXERCISE PROGRAM: Access Code: [email or web address] URL: [email or web address] Date: 07/06/2024 Prepared by: Vernell Reges  Exercises - Sidelying Thoracic Rotation with Open Book  - 1 x daily - 7 x weekly - 1 sets - 10 reps - Sidelying Shoulder Abduction  - 1 x daily - 7 x weekly - 1 sets - 10 reps - Supine Lower Trunk Rotation  - 1 x daily - 7 x weekly - 1 sets - 10 reps - Standing Gastroc Stretch  - 1 x daily - 7 x weekly - 3 sets - 30 seconds hold - Standing 'L' Stretch at [employer]  - 1 x daily - 7 x weekly - 1 sets - 10 reps  ASSESSMENT:  CLINICAL IMPRESSION: Patient arrived to PT motivated to participate in tx; able to progress core mm retraining today without pt reporting increased LBP.  Demonstrates good form with squat today; able to progress with addition of med ball front squat.  Tolerated PT well overall.  Pt should continue to benefit from skilled PT to address impairments below and facilitate improved QOL and decreased pain.  OBJECTIVE IMPAIRMENTS: Abnormal gait, decreased activity tolerance, decreased balance, decreased mobility, decreased ROM, decreased strength, impaired  flexibility, and pain.   ACTIVITY LIMITATIONS: carrying, lifting, bending, standing, squatting, and locomotion level  PARTICIPATION LIMITATIONS: cleaning, shopping, community activity, and yard work  PERSONAL FACTORS: Past/current experiences, Time since onset of injury/illness/exacerbation, and 1-2 comorbidities: s/p lumbar spine fusion, s/p cervical spine fusion, s/p R THA, see other PMH in chart are also affecting patient's functional outcome.   REHAB POTENTIAL: Good  CLINICAL DECISION MAKING: Evolving/moderate complexity  EVALUATION COMPLEXITY: Moderate   GOALS: Goals reviewed with patient? Yes  SHORT TERM GOALS: Target date: 07/10/24  Pt will be instructed on HEP for LE/trunk flexibility and ROM and LE/trunk mm neuromuscular re-ed/strengthening Baseline:at visit #2 Goal status: INITIAL   LONG TERM GOALS: Target date: 08/11/24  Improve mODI >10 points indicating pt significantly less impaired by his LBP during typical daily activities Baseline: 31/50 Goal status: INITIAL  2.  Improve Hip strength b/l 1/2 MMT to facilitate improved standing/walking tolerance to 1 hr for housework/yardwork Baseline: 4/5  hip flex Goal status: INITIAL  3.  Pt will demonstrate ability to squat/lift/carry 20 lbs with neutral spine posture to facilitate being able to perform his daily housework/yardwork  Baseline: to be initiated at future visit Goal status: INITIAL   PLAN:  PT FREQUENCY: 2x/week  PT DURATION: 6 weeks  PLANNED INTERVENTIONS: 97110-Therapeutic exercises, 97530- Therapeutic activity, 97112- Neuromuscular re-education, 97535- Self Care, and [id number]- Manual therapy.  PLAN FOR NEXT SESSION: continue trunk mm retraining, attempt pallof press (with nautius for handle instead of TB) again at next visit  Vernell Reges, PT, DPT, OCS  Wonda Goodgame E Sanjeev Main, PT 07/15/2024, 12:00 PM

## 2024-07-20 ENCOUNTER — Ambulatory Visit

## 2024-07-20 DIAGNOSIS — M5459 Other low back pain: Secondary | ICD-10-CM

## 2024-07-20 NOTE — Therapy (Signed)
 OUTPATIENT PHYSICAL THERAPY THORACOLUMBAR TREATMENT   Patient Name: Phillip Mcdaniel MRN: [id number] DOB:03-06-1971, 53 y.o., male Today's Date: 07/20/2024  END OF SESSION:  PT End of Session - 07/20/24 1040     Number of Visits 13    Date for Recertification  08/11/24    Authorization Type 2x/week x 6 weeks requested    PT Start Time 1040    PT Stop Time 1120    PT Time Calculation (min) 40 min    Activity Tolerance Patient tolerated treatment well    Behavior During Therapy WFL for tasks assessed/performed          Past Medical History:  Diagnosis Date   Anxiety    Arthritis    AVN (avascular necrosis of bone) (HCC)    RIGHT hip   Chronic neck pain    Depression    ED (erectile dysfunction)    Hepatitis    Hepatitis C s/p treatment wtih Mayvret   Hypertension    Lumbar spondylolysis    Past Surgical History:  Procedure Laterality Date   BACK SURGERY     anterior cervical   HIP SURGERY     RIGHT hip replacement   NECK SURGERY  2024   C3-6   Patient Active Problem List   Diagnosis Date Noted   Lumbar foraminal stenosis 03/16/2024   Carpal tunnel syndrome 03/11/2024   Lumbar spondylosis 03/11/2024   S/P cervical spinal fusion 03/11/2024   Chronic hepatitis C without hepatic coma (HCC) 08/12/2021   Elevated liver enzymes 08/10/2021   Alcohol abuse 08/08/2021   Anxiety 08/08/2021   Chronic neck pain 08/08/2021   Hypertension 08/08/2021   Other male erectile dysfunction 08/08/2021   Tinnitus of both ears 08/08/2021   Vision problems 08/08/2021   Substance-induced psychotic disorder (HCC) 05/06/2020   Polysubstance abuse (HCC) 05/03/2020   Depression 05/03/2020    PCP: Dr. Sol, MD  REFERRING PROVIDER: Dr. Louis, MD  REFERRING DIAG:  Diagnosis  M54.16 (ICD-10-CM) - Radiculopathy, lumbar region    Rationale for Evaluation and Treatment: Rehabilitation  THERAPY DIAG:  Other low back pain  ONSET DATE: May 2025- Lumbar fusion  SUBJECTIVE:                                                                                                                                                                                            SUBJECTIVE STATEMENT: Pt reports having lumbar fusion surgery in May 2025.  He reports the surgery went well.  He does still experience back pain- lower and upper; having tingling in legs/knees, lower back pain; having charlie horses in his legs/cramps in his  back (muscle spasms)  Pt is R hand dominant  Pt is not working now  Agg: prolonged standing and walking   Allev: hot tub  PERTINENT HISTORY:  Per chart review: bilateral L5-S1 decompressive lamintomies and foraminotomies and fusion as well as L5-S1 posterior lateral arthrodesis on 03/16/2024.  H/o R THA a few years ago too- due to AVN  Pt reports cervical fusion surgery in Feb 2025(?)- reports having neck pain/UE parasthesias intermittently L>R UE  PAIN:  Are you having pain? Yes 8/10 at worst, constant   PRECAUTIONS: None  RED FLAGS: None   WEIGHT BEARING RESTRICTIONS: No  FALLS:  Has patient fallen in last 6 months? No  LIVING ENVIRONMENT: Lives with: lives with their spouse Lives in: House/apartment Stairs: yes- few steps to enter  Has following equipment at home: none   OCCUPATION: pt is not working; disabled; not working any more; he just got a bass guitar to lean how to play and enjoys listening to music   PLOF: Independent  PATIENT GOALS:   NEXT MD VISIT: 07/30/24  OBJECTIVE:  Note: Objective measures were completed at Evaluation unless otherwise noted.  PATIENT SURVEYS:  mODI: 31/50  COGNITION: Overall cognitive status: Within functional limits for tasks assessed     SENSATION: WFL  MUSCLE LENGTH: (+) hamstring/gluteal mm tightness b/l  POSTURE: decreased lumbar lordosis  PALPATION: Incision site is healed well; normal skin appearance at scar/surrounding soft tissue  LUMBAR ROM:   AROM eval  Flexion  50% limited  Extension 50% limited  Right lateral flexion 50% limited  Left lateral flexion 50% limited  Right rotation   Left rotation    (Blank rows = not tested)  LOWER EXTREMITY ROM:     Active  Right eval Left eval  Hip flexion 95 100  Hip extension    Hip abduction    Hip adduction    Hip internal rotation    Hip external rotation    Knee flexion    Knee extension    Ankle dorsiflexion    Ankle plantarflexion    Ankle inversion    Ankle eversion     (Blank rows = not tested)  LOWER EXTREMITY MMT:    MMT Right eval Left eval  Hip flexion 4 4+  Hip extension    Hip abduction    Hip adduction    Hip internal rotation    Hip external rotation    Knee flexion 5 5  Knee extension 5 5  Ankle dorsiflexion 5 5  Ankle plantarflexion    Ankle inversion    Ankle eversion     (Blank rows = not tested)   FUNCTIONAL TESTS:  Pt able to transfer sit to supine using transfer through sidelying/log roll technique  SLS without UE support x 5-10 sec R/L   GAIT: Pt able to amb without AD into/out of clinic today; amb with decreased speed; decreased stride length, decreased trunk rotation b/l  TREATMENT DATE: 07/20/24 Subjective: Pt reports overall his back is feeling about the same today. He is tired upon arrival, didn't sleep well last night.  Objective: Manual Therapy:  Sidelying STM lumbar paraspinal mm (with TPR and hypervolt), manual QL stretch- 5 imn Manual hamstring stretch R/L: x 1 min ea- not today   Therapeutic Exercises:  Nustep x .5 miles at level 3-4 today (  longer than last session) Supine hooklying LTR x10 ea SKTC x 1 min ea Sidelying shoulder abd x 3 with diaphragmatic breathing x 2 ea rep, R  and L- not today Sidelying open book rotation stretch x 5 R and L- not today Standing calf stretch: x 1 min ea- not today Standing counter top L stretch with hands on chair x 5   Therapeutic Activities: Standing abdominal brace with pt tactile cues on  abdomen x 10 Abdominal brace + squat x 10, 4 sets holding 10 lb med ball Abdominal brace with pull/scapular retraction in standing: 40# nautilus Abdominal brace with push in standing: 40 # nautilus - not today- not today Pallof press: 30 lbs nautilus 2x10 ea direction- not today- TA bracing on physioball: with marches x 20, with knee ext x 20 TA brace with bridge x 10 (add squeeze), x15 (hip abd with blue band)   HEP instruction- see below- continue previous HEP; added abdominal brace + squat to HEP throughout his day                                                            PATIENT EDUCATION:  Education details: PT POC/goals, HEP instruction Person educated: Patient Education method: Explanation Education comprehension: verbalized understanding  HOME EXERCISE PROGRAM: Access Code: [email or web address] URL: [email or web address] Date: 07/06/2024 Prepared by: Vernell Reges  Exercises - Sidelying Thoracic Rotation with Open Book  - 1 x daily - 7 x weekly - 1 sets - 10 reps - Sidelying Shoulder Abduction  - 1 x daily - 7 x weekly - 1 sets - 10 reps - Supine Lower Trunk Rotation  - 1 x daily - 7 x weekly - 1 sets - 10 reps - Standing Gastroc Stretch  - 1 x daily - 7 x weekly - 3 sets - 30 seconds hold - Standing 'L' Stretch at [employer]  - 1 x daily - 7 x weekly - 1 sets - 10 reps  ASSESSMENT:  CLINICAL IMPRESSION: Patient arrived to PT motivated to participate in tx; able to progress core mm retraining today without pt reporting increased LBP.  Continues to demonstrate good form with squat technique.  Able to increase distance on nustep with increased steps/min cadence today compared to last week.  Scar is not sensitive to light touch.  Soft tissue mobility is improving around scar too.  Overall, he tolerated PT session well today.  Pt should continue to benefit from skilled PT to address impairments below and facilitate improved QOL and decreased pain.  OBJECTIVE IMPAIRMENTS:  Abnormal gait, decreased activity tolerance, decreased balance, decreased mobility, decreased ROM, decreased strength, impaired flexibility, and pain.   ACTIVITY LIMITATIONS: carrying, lifting, bending, standing, squatting, and locomotion level  PARTICIPATION LIMITATIONS: cleaning, shopping, community activity, and yard work  PERSONAL FACTORS: Past/current experiences, Time since onset of injury/illness/exacerbation, and 1-2 comorbidities: s/p lumbar spine fusion, s/p cervical spine fusion, s/p R THA, see other PMH in chart are also affecting patient's functional outcome.   REHAB POTENTIAL: Good  CLINICAL DECISION MAKING: Evolving/moderate complexity  EVALUATION COMPLEXITY: Moderate   GOALS: Goals reviewed with patient? Yes  SHORT TERM GOALS: Target date: 07/10/24  Pt will be instructed on HEP for LE/trunk flexibility and ROM and LE/trunk mm neuromuscular re-ed/strengthening Baseline:at visit #2 Goal status: INITIAL   LONG TERM GOALS: Target date: 08/11/24  Improve mODI >10 points indicating pt significantly less impaired by his LBP during typical daily activities Baseline: 31/50 Goal status: INITIAL  2.  Improve Hip strength b/l 1/2 MMT to facilitate improved standing/walking tolerance to 1 hr for housework/yardwork Baseline: 4/5 hip flex Goal status: INITIAL  3.  Pt will demonstrate ability to squat/lift/carry 20 lbs with neutral spine posture to facilitate being able to perform his daily housework/yardwork  Baseline: to be initiated at future visit Goal status: INITIAL   PLAN:  PT FREQUENCY: 2x/week  PT DURATION: 6 weeks  PLANNED INTERVENTIONS: 97110-Therapeutic exercises, 97530- Therapeutic activity, 97112- Neuromuscular re-education, 97535- Self Care, and 02859- Manual therapy.  PLAN FOR NEXT SESSION: continue trunk mm retraining, attempt pallof press (with nautius for handle instead of TB) again at next visit  Vernell Reges, PT, DPT, OCS  Vernell FORBES Reges,  PT 07/20/2024, 10:54 AM

## 2024-07-24 ENCOUNTER — Ambulatory Visit: Admitting: Family Medicine

## 2024-07-24 ENCOUNTER — Encounter: Payer: Self-pay | Admitting: Family Medicine

## 2024-07-24 ENCOUNTER — Ambulatory Visit

## 2024-07-24 ENCOUNTER — Other Ambulatory Visit (HOSPITAL_COMMUNITY): Payer: Self-pay

## 2024-07-24 DIAGNOSIS — M5459 Other low back pain: Secondary | ICD-10-CM

## 2024-07-24 DIAGNOSIS — I10 Essential (primary) hypertension: Secondary | ICD-10-CM | POA: Diagnosis not present

## 2024-07-24 MED ORDER — AMLODIPINE BESYLATE 5 MG PO TABS
5.0000 mg | ORAL_TABLET | Freq: Every day | ORAL | 1 refills | Status: DC
  Filled 2024-07-24: qty 90, 90d supply, fill #0

## 2024-07-24 MED ORDER — LISINOPRIL 10 MG PO TABS: 10.0000 mg | ORAL_TABLET | Freq: Every day | ORAL | 3 refills | Status: DC

## 2024-07-24 MED ORDER — LISINOPRIL 10 MG PO TABS
10.0000 mg | ORAL_TABLET | Freq: Every day | ORAL | 3 refills | Status: DC
  Filled 2024-07-24: qty 90, 90d supply, fill #0

## 2024-07-24 MED ORDER — AMLODIPINE BESYLATE 5 MG PO TABS: 5.0000 mg | ORAL_TABLET | Freq: Every day | ORAL | 1 refills | Status: DC

## 2024-07-24 NOTE — Therapy (Signed)
 OUTPATIENT PHYSICAL THERAPY THORACOLUMBAR TREATMENT   Patient Name: Phillip Mcdaniel MRN: [id number] DOB:02-23-1971, 53 y.o., male Today's Date: 07/24/2024  END OF SESSION:  PT End of Session - 07/24/24 0938     Visit Number 7    Number of Visits 13    Date for Recertification  08/11/24    Authorization Type 2x/week x 6 weeks requested    PT Start Time 0935    PT Stop Time 1015    PT Time Calculation (min) 40 min    Activity Tolerance Patient tolerated treatment well    Behavior During Therapy WFL for tasks assessed/performed          Past Medical History:  Diagnosis Date   Anxiety    Arthritis    AVN (avascular necrosis of bone) (HCC)    RIGHT hip   Chronic neck pain    Depression    ED (erectile dysfunction)    Hepatitis    Hepatitis C s/p treatment wtih Mayvret   Hypertension    Lumbar spondylolysis    Past Surgical History:  Procedure Laterality Date   BACK SURGERY     anterior cervical   HIP SURGERY     RIGHT hip replacement   NECK SURGERY  2024   C3-6   Patient Active Problem List   Diagnosis Date Noted   Lumbar foraminal stenosis 03/16/2024   Carpal tunnel syndrome 03/11/2024   Lumbar spondylosis 03/11/2024   S/P cervical spinal fusion 03/11/2024   Chronic hepatitis C without hepatic coma (HCC) 08/12/2021   Elevated liver enzymes 08/10/2021   Alcohol abuse 08/08/2021   Anxiety 08/08/2021   Chronic neck pain 08/08/2021   Hypertension 08/08/2021   Other male erectile dysfunction 08/08/2021   Tinnitus of both ears 08/08/2021   Vision problems 08/08/2021   Substance-induced psychotic disorder (HCC) 05/06/2020   Polysubstance abuse (HCC) 05/03/2020   Depression 05/03/2020    PCP: Dr. Sol, MD  REFERRING PROVIDER: Dr. Louis, MD  REFERRING DIAG:  Diagnosis  M54.16 (ICD-10-CM) - Radiculopathy, lumbar region    Rationale for Evaluation and Treatment: Rehabilitation  THERAPY DIAG:  Other low back pain  ONSET DATE: May 2025- Lumbar  fusion  SUBJECTIVE:                                                                                                                                                                                           SUBJECTIVE STATEMENT: Pt reports having lumbar fusion surgery in May 2025.  He reports the surgery went well.  He does still experience back pain- lower and upper; having tingling in legs/knees, lower back pain; having charlie  horses in his legs/cramps in his back (muscle spasms)  Pt is R hand dominant  Pt is not working now  Agg: prolonged standing and walking   Allev: hot tub  PERTINENT HISTORY:  Per chart review: bilateral L5-S1 decompressive lamintomies and foraminotomies and fusion as well as L5-S1 posterior lateral arthrodesis on 03/16/2024.  H/o R THA a few years ago too- due to AVN  Pt reports cervical fusion surgery in Feb 2025(?)- reports having neck pain/UE parasthesias intermittently L>R UE  PAIN:  Are you having pain? Yes 8/10 at worst, constant   PRECAUTIONS: None  RED FLAGS: None   WEIGHT BEARING RESTRICTIONS: No  FALLS:  Has patient fallen in last 6 months? No  LIVING ENVIRONMENT: Lives with: lives with their spouse Lives in: House/apartment Stairs: yes- few steps to enter  Has following equipment at home: none   OCCUPATION: pt is not working; disabled; not working any more; he just got a bass guitar to lean how to play and enjoys listening to music   PLOF: Independent  PATIENT GOALS:   NEXT MD VISIT: 07/30/24  OBJECTIVE:  Note: Objective measures were completed at Evaluation unless otherwise noted.  PATIENT SURVEYS:  mODI: 31/50  COGNITION: Overall cognitive status: Within functional limits for tasks assessed     SENSATION: WFL  MUSCLE LENGTH: (+) hamstring/gluteal mm tightness b/l  POSTURE: decreased lumbar lordosis  PALPATION: Incision site is healed well; normal skin appearance at scar/surrounding soft tissue  LUMBAR ROM:    AROM eval  Flexion 50% limited  Extension 50% limited  Right lateral flexion 50% limited  Left lateral flexion 50% limited  Right rotation   Left rotation    (Blank rows = not tested)  LOWER EXTREMITY ROM:     Active  Right eval Left eval  Hip flexion 95 100  Hip extension    Hip abduction    Hip adduction    Hip internal rotation    Hip external rotation    Knee flexion    Knee extension    Ankle dorsiflexion    Ankle plantarflexion    Ankle inversion    Ankle eversion     (Blank rows = not tested)  LOWER EXTREMITY MMT:    MMT Right eval Left eval  Hip flexion 4 4+  Hip extension    Hip abduction    Hip adduction    Hip internal rotation    Hip external rotation    Knee flexion 5 5  Knee extension 5 5  Ankle dorsiflexion 5 5  Ankle plantarflexion    Ankle inversion    Ankle eversion     (Blank rows = not tested)   FUNCTIONAL TESTS:  Pt able to transfer sit to supine using transfer through sidelying/log roll technique  SLS without UE support x 5-10 sec R/L   GAIT: Pt able to amb without AD into/out of clinic today; amb with decreased speed; decreased stride length, decreased trunk rotation b/l  TREATMENT DATE: 07/24/24 Subjective: Pt reports overall his back is feeling about the same today. Feels a little sore.  No new concerns.  Has an appointment with his PCP after PT today to look at his wrist.    Objective: Manual Therapy: not today Sidelying STM lumbar paraspinal mm (with TPR and hypervolt), manual QL stretch- 5 imn Manual hamstring stretch R/L: x 1 min ea- not today   Therapeutic Exercises:  Nustep x .5 miles at level 3-4 today Supine hooklying LTR x10 ea SKTC x  1 min ea Sidelying shoulder abd x 3 with diaphragmatic breathing x 2 ea rep, R and L- not today Sidelying open book rotation stretch x 5 R and L- not today Standing calf stretch: x 1 min ea- not today Standing counter top L stretch with hands on chair x 5   Therapeutic  Activities: Standing abdominal brace with pt tactile cues on abdomen x 10- not today Squat+ overhead press with 10 lbs weight ball: 2x 1 minute Abdominal brace with pull/scapular retraction in standing: 40# nautilus Abdominal brace with push in standing: 40 # nautilus - not today- not today Pallof press: 30 lbs nautilus 2x10 ea direction- not today- TA bracing on physioball: with marches x 20, with knee ext x 20 TA brace with bridge x 10 (add squeeze), x15 (hip abd with blue band) Lateral band walk with blue t-band at ankles- 3 laps x 15 ft ea direction   HEP instruction- see below- continue previous HEP; added abdominal brace + squat to HEP throughout his day                                                            PATIENT EDUCATION:  Education details: PT POC/goals, HEP instruction Person educated: Patient Education method: Explanation Education comprehension: verbalized understanding  HOME EXERCISE PROGRAM: Access Code: [email or web address] URL: [email or web address] Date: 07/06/2024 Prepared by: Vernell Reges  Exercises - Sidelying Thoracic Rotation with Open Book  - 1 x daily - 7 x weekly - 1 sets - 10 reps - Sidelying Shoulder Abduction  - 1 x daily - 7 x weekly - 1 sets - 10 reps - Supine Lower Trunk Rotation  - 1 x daily - 7 x weekly - 1 sets - 10 reps - Standing Gastroc Stretch  - 1 x daily - 7 x weekly - 3 sets - 30 seconds hold - Standing 'L' Stretch at [employer]  - 1 x daily - 7 x weekly - 1 sets - 10 reps  ASSESSMENT:  CLINICAL IMPRESSION: Patient arrived to PT motivated to participate in tx; able to progress core mm retraining today without pt reporting increased LBP.  Continues to demonstrate good form with squat and over head press endurance exercise  He was challenged appropriately with new lateral band walk for glutes/lateral lumbopelvic mm.  No increase in LBP noted at end of session.  Overall, he tolerated PT session well today.  Pt should continue to  benefit from skilled PT to address impairments below and facilitate improved QOL and decreased pain.  OBJECTIVE IMPAIRMENTS: Abnormal gait, decreased activity tolerance, decreased balance, decreased mobility, decreased ROM, decreased strength, impaired flexibility, and pain.   ACTIVITY LIMITATIONS: carrying, lifting, bending, standing, squatting, and locomotion level  PARTICIPATION LIMITATIONS: cleaning, shopping, community activity, and yard work  PERSONAL FACTORS: Past/current experiences, Time since onset of injury/illness/exacerbation, and 1-2 comorbidities: s/p lumbar spine fusion, s/p cervical spine fusion, s/p R THA, see other PMH in chart are also affecting patient's functional outcome.   REHAB POTENTIAL: Good  CLINICAL DECISION MAKING: Evolving/moderate complexity  EVALUATION COMPLEXITY: Moderate   GOALS: Goals reviewed with patient? Yes  SHORT TERM GOALS: Target date: 07/10/24  Pt will be instructed on HEP for LE/trunk flexibility and ROM and LE/trunk mm neuromuscular re-ed/strengthening Baseline:at visit #2 Goal status: INITIAL   LONG TERM GOALS:  Target date: 08/11/24  Improve mODI >10 points indicating pt significantly less impaired by his LBP during typical daily activities Baseline: 31/50 Goal status: INITIAL  2.  Improve Hip strength b/l 1/2 MMT to facilitate improved standing/walking tolerance to 1 hr for housework/yardwork Baseline: 4/5 hip flex Goal status: INITIAL  3.  Pt will demonstrate ability to squat/lift/carry 20 lbs with neutral spine posture to facilitate being able to perform his daily housework/yardwork  Baseline: to be initiated at future visit Goal status: INITIAL   PLAN:  PT FREQUENCY: 2x/week  PT DURATION: 6 weeks  PLANNED INTERVENTIONS: 97110-Therapeutic exercises, 97530- Therapeutic activity, 97112- Neuromuscular re-education, 97535- Self Care, and 02859- Manual therapy.  PLAN FOR NEXT SESSION: continue trunk mm retraining, attempt  pallof press (with nautius for handle instead of TB) again at next visit  Vernell Reges, PT, DPT, OCS  Vernell FORBES Reges, PT 07/24/2024, 9:38 AM

## 2024-07-24 NOTE — Progress Notes (Signed)
   Established Patient Office Visit  Subjective   Patient ID: Phillip Mcdaniel, male    DOB: 12/24/1970  Age: 53 y.o. MRN: [id number]  Chief Complaint  Patient presents with   Hypertension    Patient presents today for HTN. He needs refill on HTN medications. He has been out for the last 2 days.     Assessment & Plan:   Problem List Items Addressed This Visit   None Visit Diagnoses       Uncontrolled hypertension       Relevant Medications   amLODipine  (NORVASC ) 5 MG tablet   lisinopril  (ZESTRIL ) 10 MG tablet     Assessment and Plan Assessment & Plan Hypertension Hypertension uncontrolled due to non-adherence and medication unavailability. Asymptomatic. - Increase dose of antihypertensive medication. - Ensure medication pickup before depletion. - Send prescription to [facility] in [place]. - Provide 90-day supply of medication.    No follow-ups on file.   HPI Discussed the use of AI scribe software for clinical note transcription with the patient, who gave verbal consent to proceed.  History of Present Illness He is a 53 year old male with hypertension who presents for a blood pressure follow-up.  He has been experiencing difficulty obtaining his blood pressure medication due to transportation issues, as he relies on his wife for rides and she works late hours. Consequently, he has been out of his medication for about a week.  He mentions an upcoming appointment with a specialist for his hand, which has been causing pain and numbness, particularly in the elbow and fingers.  He discusses a previous Cologuard test, for which he sent a sample but never received results. At the time, he was living in a different part of the [place] and no longer has the test box.  BP Readings from Last 3 Encounters:  07/24/24 (!) 160/106  03/18/24 139/73  03/11/24 (!) 160/110      Review of Systems  All other systems reviewed and are negative.     Objective:     BP (!) 160/106    Pulse 83   Ht 5' 7 (1.702 m)   Wt 181 lb (82.1 kg)   SpO2 99%   BMI 28.35 kg/m    Physical Exam Vitals and nursing note reviewed.  Constitutional:      Appearance: Normal appearance.  HENT:     Head: Normocephalic.     Right Ear: External ear normal.     Left Ear: External ear normal.  Eyes:     Conjunctiva/sclera: Conjunctivae normal.  Cardiovascular:     Rate and Rhythm: Normal rate.  Pulmonary:     Effort: Pulmonary effort is normal. No respiratory distress.  Abdominal:     Palpations: Abdomen is soft.  Musculoskeletal:        General: Normal range of motion.  Skin:    General: Skin is warm.  Neurological:     Mental Status: He is alert and oriented to person, place, and time.  Psychiatric:        Mood and Affect: Mood normal.      No results found for any visits on 07/24/24.    The 10-year ASCVD risk score (Arnett DK, et al., 2019) is: 8.9%      Ladoris MARLA Ny, MD

## 2024-07-29 ENCOUNTER — Ambulatory Visit: Attending: Neurosurgery

## 2024-07-29 ENCOUNTER — Ambulatory Visit: Payer: Self-pay | Admitting: Radiology

## 2024-07-29 ENCOUNTER — Ambulatory Visit: Admitting: Family Medicine

## 2024-07-29 ENCOUNTER — Encounter: Payer: Self-pay | Admitting: Family Medicine

## 2024-07-29 ENCOUNTER — Encounter: Admitting: Family Medicine

## 2024-07-29 VITALS — BP 136/82 | HR 70 | Ht 67.0 in | Wt 188.0 lb

## 2024-07-29 DIAGNOSIS — M5459 Other low back pain: Secondary | ICD-10-CM | POA: Insufficient documentation

## 2024-07-29 DIAGNOSIS — R2230 Localized swelling, mass and lump, unspecified upper limb: Secondary | ICD-10-CM

## 2024-07-29 DIAGNOSIS — M67432 Ganglion, left wrist: Secondary | ICD-10-CM | POA: Diagnosis not present

## 2024-07-29 NOTE — Assessment & Plan Note (Addendum)
 History of Present Illness Phillip Mcdaniel is a 53 year old male with a ganglion cyst who presents with pain and numbness in the left wrist.  Left wrist ganglion cyst and associated pain - Ganglion cyst present on the left wrist for approximately 18 months - Persistent, significant pain in the left wrist disrupting daily activities - Pain sometimes alleviated by gripping the hand tightly - Pain in the hand interferes with participation in physical therapy  Paresthesia and numbness - Numbness in the left elbow and tingling in the fingers and hands - Chronic numbness of the middle finger - Symptoms initially improved after neck surgery in February but have since recurred  Physical Exam INSPECTION: Prominent Left wrist volar radial ganglion cyst present. PALPATION: Tenderness over left wrist ganglion cyst.  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 High risk of recurrence and complications with aspiration due to proximity to artery. Surgical evaluation recommended. - Refer to orthopedics for surgical evaluation and management. - Provide wrist brace to minimize movement and reduce irritation.

## 2024-07-29 NOTE — Therapy (Signed)
 OUTPATIENT PHYSICAL THERAPY THORACOLUMBAR TREATMENT   Patient Name: Phillip Mcdaniel MRN: [id number] DOB:[birth year], 53 y.o., male Today's Date: 07/29/2024  END OF SESSION:  PT End of Session - 07/29/24 1001     Visit Number 8    Number of Visits 13    Date for Recertification  08/11/24    Authorization Type 2x/week x 6 weeks requested    PT Start Time 0950    PT Stop Time 1035    PT Time Calculation (min) 45 min    Activity Tolerance Patient tolerated treatment well    Behavior During Therapy WFL for tasks assessed/performed          Past Medical History:  Diagnosis Date   Anxiety    Arthritis    AVN (avascular necrosis of bone) (HCC)    RIGHT hip   Chronic neck pain    Depression    ED (erectile dysfunction)    Hepatitis    Hepatitis C s/p treatment wtih Mayvret   Hypertension    Lumbar spondylolysis    Past Surgical History:  Procedure Laterality Date   BACK SURGERY     anterior cervical   HIP SURGERY     RIGHT hip replacement   NECK SURGERY  2024   C3-6   Patient Active Problem List   Diagnosis Date Noted   Ganglion cyst of volar aspect of left wrist 07/29/2024   Lumbar foraminal stenosis 03/16/2024   Carpal tunnel syndrome 03/11/2024   Lumbar spondylosis 03/11/2024   S/P cervical spinal fusion 03/11/2024   Chronic hepatitis C without hepatic coma (HCC) 08/12/2021   Elevated liver enzymes 08/10/2021   Alcohol abuse 08/08/2021   Anxiety 08/08/2021   Chronic neck pain 08/08/2021   Hypertension 08/08/2021   Other male erectile dysfunction 08/08/2021   Tinnitus of both ears 08/08/2021   Vision problems 08/08/2021   Substance-induced psychotic disorder (HCC) 05/06/2020   Polysubstance abuse (HCC) 05/03/2020   Depression 05/03/2020    PCP: Dr. Sol, MD  REFERRING PROVIDER: Dr. Louis, MD  REFERRING DIAG:  Diagnosis  M54.16 (ICD-10-CM) - Radiculopathy, lumbar region    Rationale for Evaluation and Treatment: Rehabilitation  THERAPY DIAG:   Other low back pain  ONSET DATE: May 2025- Lumbar fusion  SUBJECTIVE:                                                                                                                                                                                           SUBJECTIVE STATEMENT: Pt reports having lumbar fusion surgery in May 2025.  He reports the surgery went well.  He does still experience back pain- lower  and upper; having tingling in legs/knees, lower back pain; having charlie horses in his legs/cramps in his back (muscle spasms)  Pt is R hand dominant  Pt is not working now  Agg: prolonged standing and walking   Allev: hot tub  PERTINENT HISTORY:  Per chart review: bilateral L5-S1 decompressive lamintomies and foraminotomies and fusion as well as L5-S1 posterior lateral arthrodesis on 03/16/2024.  H/o R THA a few years ago too- due to AVN  Pt reports cervical fusion surgery in Feb 2025(?)- reports having neck pain/UE parasthesias intermittently L>R UE  PAIN:  Are you having pain? Yes 8/10 at worst, constant   PRECAUTIONS: None  RED FLAGS: None   WEIGHT BEARING RESTRICTIONS: No  FALLS:  Has patient fallen in last 6 months? No  LIVING ENVIRONMENT: Lives with: lives with their spouse Lives in: House/apartment Stairs: yes- few steps to enter  Has following equipment at home: none   OCCUPATION: pt is not working; disabled; not working any more; he just got a bass guitar to lean how to play and enjoys listening to music   PLOF: Independent  PATIENT GOALS:   NEXT MD VISIT: 07/30/24  OBJECTIVE:  Note: Objective measures were completed at Evaluation unless otherwise noted.  PATIENT SURVEYS:  mODI: 31/50  COGNITION: Overall cognitive status: Within functional limits for tasks assessed     SENSATION: WFL  MUSCLE LENGTH: (+) hamstring/gluteal mm tightness b/l  POSTURE: decreased lumbar lordosis  PALPATION: Incision site is healed well; normal skin  appearance at scar/surrounding soft tissue  LUMBAR ROM:   AROM eval  Flexion 50% limited  Extension 50% limited  Right lateral flexion 50% limited  Left lateral flexion 50% limited  Right rotation   Left rotation    (Blank rows = not tested)  LOWER EXTREMITY ROM:     Active  Right eval Left eval  Hip flexion 95 100  Hip extension    Hip abduction    Hip adduction    Hip internal rotation    Hip external rotation    Knee flexion    Knee extension    Ankle dorsiflexion    Ankle plantarflexion    Ankle inversion    Ankle eversion     (Blank rows = not tested)  LOWER EXTREMITY MMT:    MMT Right eval Left eval  Hip flexion 4 4+  Hip extension    Hip abduction    Hip adduction    Hip internal rotation    Hip external rotation    Knee flexion 5 5  Knee extension 5 5  Ankle dorsiflexion 5 5  Ankle plantarflexion    Ankle inversion    Ankle eversion     (Blank rows = not tested)   FUNCTIONAL TESTS:  Pt able to transfer sit to supine using transfer through sidelying/log roll technique  SLS without UE support x 5-10 sec R/L   GAIT: Pt able to amb without AD into/out of clinic today; amb with decreased speed; decreased stride length, decreased trunk rotation b/l  TREATMENT DATE: 07/29/24 Subjective: Pt reports overall his back is feeling about the same.  Saw MD this morning and confirmed he has a L wrist ganglion; started wearing an immobilizer today for this.    Objective:  Modified tx today as pt wearing L wrist immobilizer during tx   Therapeutic Exercises:  Nustep x .6 miles at level 4-5 today (increased distance) Supine hooklying LTR x10 ea- not today SKTC x 1 min ea- not today  Sidelying shoulder abd x 3 with diaphragmatic breathing x 2 ea rep, R and L- not today Sidelying open book rotation stretch x 5 R and L- not today Standing calf stretch: x 1 min ea- not today Standing counter top L stretch with hands on chair x 5   Therapeutic  Activities: Standing abdominal brace with all therapeutic activities today Amb x 4 laps in hallway with upright posture Squat+ overhead press with 10 lbs weight ball: 2x 1 minute- not today Abdominal brace with pull/scapular retraction in standing: 40# nautilus- not today Abdominal brace with push in standing: 40 # nautilus - not today- not today Pallof press: 30 lbs nautilus 2x10 ea direction- not today Physioball behind back wall squat holds- 5 seconds, x 20 TA bracing on physioball: with marches x 20, with knee ext x 20 TA brace with bridge x 10 (add squeeze), x15 (hip abd with blue band)- not today Lateral band walk with blue t-band at ankles- 3 laps x 15 ft ea direction Standing SLS on bosu: 5x ea LE Standing hip extension- 4# 2x10 Standing hip abd- 4# 2x10 Standing marches- 4# 2x10   HEP instruction- see below- continue previous HEP; added abdominal brace + squat to HEP throughout his day                                                            PATIENT EDUCATION:  Education details: PT POC/goals, HEP instruction Person educated: Patient Education method: Explanation Education comprehension: verbalized understanding  HOME EXERCISE PROGRAM: Access Code: [email or web address] URL: [email or web address] Date: 07/06/2024 Prepared by: Vernell Reges  Exercises - Sidelying Thoracic Rotation with Open Book  - 1 x daily - 7 x weekly - 1 sets - 10 reps - Sidelying Shoulder Abduction  - 1 x daily - 7 x weekly - 1 sets - 10 reps - Supine Lower Trunk Rotation  - 1 x daily - 7 x weekly - 1 sets - 10 reps - Standing Gastroc Stretch  - 1 x daily - 7 x weekly - 3 sets - 30 seconds hold - Standing 'L' Stretch at Counter  - 1 x daily - 7 x weekly - 1 sets - 10 reps  ASSESSMENT:  CLINICAL IMPRESSION: Patient arrived to PT motivated to participate in tx; able to progress core mm retraining today.  Modified tx program as pt just came from [facility] and is wearing a L wrist immobilizer for wrist  ganglion.  He tolerated tx well and was challenged appropriately with lumbopelvic mm retraining exercises in standing today.  No increase in LBP noted at end of session.  Pt should continue to benefit from skilled PT to address impairments below and facilitate improved QOL and decreased pain.  OBJECTIVE IMPAIRMENTS: Abnormal gait, decreased activity tolerance, decreased balance, decreased mobility, decreased ROM, decreased strength, impaired flexibility, and pain.   ACTIVITY LIMITATIONS: carrying, lifting, bending, standing, squatting, and locomotion level  PARTICIPATION LIMITATIONS: cleaning, shopping, community activity, and yard work  PERSONAL FACTORS: Past/current experiences, Time since onset of injury/illness/exacerbation, and 1-2 comorbidities: s/p lumbar spine fusion, s/p cervical spine fusion, s/p R THA, see other PMH in chart are also affecting patient's functional outcome.   REHAB POTENTIAL: Good  CLINICAL DECISION MAKING: Evolving/moderate complexity  EVALUATION COMPLEXITY: Moderate   GOALS: Goals reviewed with patient? Yes  SHORT TERM GOALS: Target date: 07/10/24  Pt will be instructed on HEP for LE/trunk flexibility and ROM and LE/trunk mm neuromuscular re-ed/strengthening Baseline:at visit #2 Goal status: INITIAL   LONG TERM GOALS: Target date: 08/11/24  Improve mODI >10 points indicating pt significantly less impaired by his LBP during typical daily activities Baseline: 31/50 Goal status: INITIAL  2.  Improve Hip strength b/l 1/2 MMT to facilitate improved standing/walking tolerance to 1 hr for housework/yardwork Baseline: 4/5 hip flex Goal status: INITIAL  3.  Pt will demonstrate ability to squat/lift/carry 20 lbs with neutral spine posture to facilitate being able to perform his daily housework/yardwork  Baseline: to be initiated at future visit Goal status: INITIAL   PLAN:  PT FREQUENCY: 2x/week  PT DURATION: 6 weeks  PLANNED INTERVENTIONS:  97110-Therapeutic exercises, 97530- Therapeutic activity, 97112- Neuromuscular re-education, 97535- Self Care, and 02859- Manual therapy.  PLAN FOR NEXT SESSION: continue trunk mm retraining  Vernell Reges, PT, DPT, OCS  Vernell FORBES Reges, PT 07/29/2024, 10:02 AM

## 2024-07-29 NOTE — Patient Instructions (Addendum)
 VISIT SUMMARY:  You visited us  today due to pain and numbness in your left wrist caused by a ganglion cyst. We discussed treatment options and next steps to help manage your symptoms.  YOUR PLAN:  GANGLION CYST OF LEFT WRIST: You have a ganglion cyst on your left wrist that is causing pain and affecting your daily activities. -We recommend you see an orthopedic specialist for a surgical evaluation and management of the cyst. -Use a wrist brace to minimize movement and reduce irritation.

## 2024-07-29 NOTE — Progress Notes (Signed)
 Primary Care / Sports Medicine Office Visit  Patient Information:  Patient ID: ISSIAH HUFFAKER, male DOB: 1971/02/18 Age: 53 y.o. MRN: [id number]   JEAN SKOW is a pleasant 53 y.o. male presenting with the following:  Chief Complaint  Patient presents with   Wrist Pain    Left wrist pain x 1 year. Numbness and tingling in hand and fingers.Ganglion cyst on radial aspect of wrist starting to cause pain.     Vitals:   07/29/24 0907  BP: 136/82  Pulse: 70  SpO2: 98%   Vitals:   07/29/24 0907  Weight: 188 lb (85.3 kg)  Height: 5' 7 (1.702 m)   Body mass index is 29.44 kg/m.  US  LIMITED JOINT SPACE STRUCTURES UP LEFT Result Date: 07/29/2024 Ultrasound of the Volar Wrist (with Duplex Evaluation) Samsung HS60 device utilized with permanent recording / reporting. FINDINGS: Targeted sonographic evaluation of the volar wrist demonstrates a well-defined, hypoechoic cystic structure. The lesion is located adjacent to the radial artery, with portions closely abutting the arterial wall. Duplex Doppler evaluation confirms normal arterial flow with no evidence of vascular compromise. No internal vascularity is demonstrated within the cystic lesion. Surrounding soft tissues appear unremarkable. IMPRESSION: Prominent hypoechoic structure along the volar wrist consistent with a ganglion cyst, closely abutting the radial artery. No abnormal vascular flow identified within the lesion or the adjacent artery.     Discussed the use of AI scribe software for clinical note transcription with the patient, who gave verbal consent to proceed.   Independent interpretation of notes and tests performed by another provider:   See above for independently performed limited sonographic evaluation.  Procedures performed:   None  Pertinent History, Exam, Impression, and Recommendations:   Problem List Items Addressed This Visit     Ganglion cyst of volar aspect of left wrist - Primary   History  of Present Illness EMRIC KOWALEWSKI is a 53 year old male with a ganglion cyst who presents with pain and numbness in the left wrist.  Left wrist ganglion cyst and associated pain - Ganglion cyst present on the left wrist for approximately 18 months - Persistent, significant pain in the left wrist disrupting daily activities - Pain sometimes alleviated by gripping the hand tightly - Pain in the hand interferes with participation in physical therapy  Paresthesia and numbness - Numbness in the left elbow and tingling in the fingers and hands - Chronic numbness of the middle finger - Symptoms initially improved after neck surgery in February but have since recurred  Physical Exam INSPECTION: Prominent Left wrist volar radial ganglion cyst present. PALPATION: Tenderness over left wrist ganglion cyst.  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 High risk of recurrence and complications with aspiration due to proximity to artery. Surgical evaluation recommended. - Refer to orthopedics for surgical evaluation and management. - Provide wrist brace to minimize movement and reduce irritation.      Relevant Orders   US  LIMITED JOINT SPACE STRUCTURES UP LEFT (Completed)   Ambulatory referral to Orthopedic Surgery     Orders & Medications Medications: No orders of the defined types were placed in this encounter.  Orders Placed This Encounter  Procedures   US  LIMITED JOINT SPACE STRUCTURES UP LEFT   Ambulatory referral to Orthopedic Surgery     No follow-ups on file.     Louis Gaw J Breah Joa, MD, [facility]   Primary Care Sports Medicine Primary Care  and Sports Medicine at [employer]

## 2024-07-30 ENCOUNTER — Telehealth: Payer: Self-pay

## 2024-07-30 ENCOUNTER — Other Ambulatory Visit: Payer: Self-pay | Admitting: Family Medicine

## 2024-07-30 DIAGNOSIS — Z1211 Encounter for screening for malignant neoplasm of colon: Secondary | ICD-10-CM

## 2024-07-30 NOTE — Telephone Encounter (Signed)
 Order placed

## 2024-07-30 NOTE — Telephone Encounter (Signed)
 Patient has been made aware, verbalized understanding.

## 2024-07-30 NOTE — Telephone Encounter (Signed)
 Copied from CRM [id number]. Topic: Clinical - Request for Lab/Test Order >> Jul 30, 2024  2:59 PM Berwyn MATSU wrote: Reason for CRM: patient called in requesting for Md to order Cologaurd kit.   May you please assist.

## 2024-08-03 ENCOUNTER — Ambulatory Visit

## 2024-08-03 DIAGNOSIS — M5459 Other low back pain: Secondary | ICD-10-CM

## 2024-08-03 NOTE — Therapy (Signed)
 OUTPATIENT PHYSICAL THERAPY THORACOLUMBAR TREATMENT   Patient Name: Phillip Mcdaniel MRN: [id number] DOB:1970-12-24, 53 y.o., male Today's Date: 08/03/2024  END OF SESSION:  PT End of Session - 08/03/24 1112     Visit Number 9    Number of Visits 13    Date for Recertification  08/11/24    Authorization Type 2x/week x 6 weeks requested    PT Start Time 1115    PT Stop Time 1200    PT Time Calculation (min) 45 min    Activity Tolerance Patient tolerated treatment well    Behavior During Therapy WFL for tasks assessed/performed          Past Medical History:  Diagnosis Date   Anxiety    Arthritis    AVN (avascular necrosis of bone) (HCC)    RIGHT hip   Chronic neck pain    Depression    ED (erectile dysfunction)    Hepatitis    Hepatitis C s/p treatment wtih Mayvret   Hypertension    Lumbar spondylolysis    Past Surgical History:  Procedure Laterality Date   BACK SURGERY     anterior cervical   HIP SURGERY     RIGHT hip replacement   NECK SURGERY  2024   C3-6   Patient Active Problem List   Diagnosis Date Noted   Ganglion cyst of volar aspect of left wrist 07/29/2024   Lumbar foraminal stenosis 03/16/2024   Carpal tunnel syndrome 03/11/2024   Lumbar spondylosis 03/11/2024   S/P cervical spinal fusion 03/11/2024   Chronic hepatitis C without hepatic coma (HCC) 08/12/2021   Elevated liver enzymes 08/10/2021   Alcohol abuse 08/08/2021   Anxiety 08/08/2021   Chronic neck pain 08/08/2021   Hypertension 08/08/2021   Other male erectile dysfunction 08/08/2021   Tinnitus of both ears 08/08/2021   Vision problems 08/08/2021   Substance-induced psychotic disorder (HCC) 05/06/2020   Polysubstance abuse (HCC) 05/03/2020   Depression 05/03/2020    PCP: Dr. Sol, MD  REFERRING PROVIDER: Dr. Louis, MD  REFERRING DIAG:  Diagnosis  M54.16 (ICD-10-CM) - Radiculopathy, lumbar region    Rationale for Evaluation and Treatment: Rehabilitation  THERAPY DIAG:   Other low back pain  ONSET DATE: May 2025- Lumbar fusion  SUBJECTIVE:                                                                                                                                                                                           SUBJECTIVE STATEMENT: Pt reports having lumbar fusion surgery in May 2025.  He reports the surgery went well.  He does still experience back pain- lower  and upper; having tingling in legs/knees, lower back pain; having charlie horses in his legs/cramps in his back (muscle spasms)  Pt is R hand dominant  Pt is not working now  Agg: prolonged standing and walking   Allev: hot tub  PERTINENT HISTORY:  Per chart review: bilateral L5-S1 decompressive lamintomies and foraminotomies and fusion as well as L5-S1 posterior lateral arthrodesis on 03/16/2024.  H/o R THA a few years ago too- due to AVN  Pt reports cervical fusion surgery in Feb 2025(?)- reports having neck pain/UE parasthesias intermittently L>R UE  PAIN:  Are you having pain? Yes 8/10 at worst, constant   PRECAUTIONS: None  RED FLAGS: None   WEIGHT BEARING RESTRICTIONS: No  FALLS:  Has patient fallen in last 6 months? No  LIVING ENVIRONMENT: Lives with: lives with their spouse Lives in: House/apartment Stairs: yes- few steps to enter  Has following equipment at home: none   OCCUPATION: pt is not working; disabled; not working any more; he just got a bass guitar to lean how to play and enjoys listening to music   PLOF: Independent  PATIENT GOALS:   NEXT MD VISIT: 07/30/24  OBJECTIVE:  Note: Objective measures were completed at Evaluation unless otherwise noted.  PATIENT SURVEYS:  mODI: 31/50  COGNITION: Overall cognitive status: Within functional limits for tasks assessed     SENSATION: WFL  MUSCLE LENGTH: (+) hamstring/gluteal mm tightness b/l  POSTURE: decreased lumbar lordosis  PALPATION: Incision site is healed well; normal skin  appearance at scar/surrounding soft tissue  LUMBAR ROM:   AROM eval  Flexion 50% limited  Extension 50% limited  Right lateral flexion 50% limited  Left lateral flexion 50% limited  Right rotation   Left rotation    (Blank rows = not tested)  LOWER EXTREMITY ROM:     Active  Right eval Left eval  Hip flexion 95 100  Hip extension    Hip abduction    Hip adduction    Hip internal rotation    Hip external rotation    Knee flexion    Knee extension    Ankle dorsiflexion    Ankle plantarflexion    Ankle inversion    Ankle eversion     (Blank rows = not tested)  LOWER EXTREMITY MMT:    MMT Right eval Left eval  Hip flexion 4 4+  Hip extension    Hip abduction    Hip adduction    Hip internal rotation    Hip external rotation    Knee flexion 5 5  Knee extension 5 5  Ankle dorsiflexion 5 5  Ankle plantarflexion    Ankle inversion    Ankle eversion     (Blank rows = not tested)   FUNCTIONAL TESTS:  Pt able to transfer sit to supine using transfer through sidelying/log roll technique  SLS without UE support x 5-10 sec R/L   GAIT: Pt able to amb without AD into/out of clinic today; amb with decreased speed; decreased stride length, decreased trunk rotation b/l  TREATMENT DATE: 08/03/24 Subjective: Pt reports overall his back is feeling about the same.  Waiting for a call from orthopedics about his wrist ganglion.   Objective:   Therapeutic Exercises:  Nustep x ..68 miles at level 4-5 today (increased distance)- no arms as pt's L wrist bothering him Supine hooklying LTR x10 ea SKTC x 1 min ea- not today Sidelying shoulder abd x 3 with diaphragmatic breathing x 2 ea rep, R and L- not  today Sidelying open book rotation stretch x 5 R and L- not today Standing calf stretch: x 1 min ea- not today Standing counter top L stretch with hands on chair x 5   Therapeutic Activities: Standing abdominal brace with all therapeutic activities today Amb x 4 laps in  hallway with upright posture Squat+ overhead press with 10 lbs weight ball: 2x 1 minute- not today Abdominal brace with pull/scapular retraction in standing: 40# nautilus- not today Abdominal brace with push in standing: 40 # nautilus - not today- not today Pallof press: 30 lbs nautilus 2x10 ea direction- not today Physioball behind back wall squat holds- 5 seconds, x 20- not today TA bracing on physioball: with marches x 20, with knee ext x 20 TA brace with bridge x 10 (add squeeze), x15 (hip abd with blue band)- not today Lateral band walk with blue t-band at ankles- 3 laps x 15 ft ea direction Standing SLS on bosu: 5x ea LE Bosu squats: 5 second holds x 15 Standing hip extension- 5# 2x12 Standing hip abd- 5# 2x12 Standing marches- 5# 2x12   HEP instruction- see below- continue previous HEP; added abdominal brace + squat to HEP throughout his day                                                            PATIENT EDUCATION:  Education details: PT POC/goals, HEP instruction Person educated: Patient Education method: Explanation Education comprehension: verbalized understanding  HOME EXERCISE PROGRAM: Access Code: [email or web address] URL: [email or web address] Date: 07/06/2024 Prepared by: Vernell Reges  Exercises - Sidelying Thoracic Rotation with Open Book  - 1 x daily - 7 x weekly - 1 sets - 10 reps - Sidelying Shoulder Abduction  - 1 x daily - 7 x weekly - 1 sets - 10 reps - Supine Lower Trunk Rotation  - 1 x daily - 7 x weekly - 1 sets - 10 reps - Standing Gastroc Stretch  - 1 x daily - 7 x weekly - 3 sets - 30 seconds hold - Standing 'L' Stretch at Counter  - 1 x daily - 7 x weekly - 1 sets - 10 reps  ASSESSMENT:  CLINICAL IMPRESSION: Patient arrived motivated to participate in PT today.  Able to tolerate progression of strengthening with increased resistance and increased distance on nustep.  He tolerated tx well and was challenged appropriately with lumbopelvic mm  retraining exercises in standing today.  No increase in LBP noted at end of session.  Pt should continue to benefit from skilled PT to address impairments below and facilitate improved QOL and decreased pain.  OBJECTIVE IMPAIRMENTS: Abnormal gait, decreased activity tolerance, decreased balance, decreased mobility, decreased ROM, decreased strength, impaired flexibility, and pain.   ACTIVITY LIMITATIONS: carrying, lifting, bending, standing, squatting, and locomotion level  PARTICIPATION LIMITATIONS: cleaning, shopping, community activity, and yard work  PERSONAL FACTORS: Past/current experiences, Time since onset of injury/illness/exacerbation, and 1-2 comorbidities: s/p lumbar spine fusion, s/p cervical spine fusion, s/p R THA, see other PMH in chart are also affecting patient's functional outcome.   REHAB POTENTIAL: Good  CLINICAL DECISION MAKING: Evolving/moderate complexity  EVALUATION COMPLEXITY: Moderate   GOALS: Goals reviewed with patient? Yes  SHORT TERM GOALS: Target date: 07/10/24  Pt will be instructed on HEP for LE/trunk flexibility and ROM and LE/trunk  mm neuromuscular re-ed/strengthening Baseline:at visit #2 Goal status: INITIAL   LONG TERM GOALS: Target date: 08/11/24  Improve mODI >10 points indicating pt significantly less impaired by his LBP during typical daily activities Baseline: 31/50 Goal status: INITIAL  2.  Improve Hip strength b/l 1/2 MMT to facilitate improved standing/walking tolerance to 1 hr for housework/yardwork Baseline: 4/5 hip flex Goal status: INITIAL  3.  Pt will demonstrate ability to squat/lift/carry 20 lbs with neutral spine posture to facilitate being able to perform his daily housework/yardwork  Baseline: to be initiated at future visit Goal status: INITIAL   PLAN:  PT FREQUENCY: 2x/week  PT DURATION: 6 weeks  PLANNED INTERVENTIONS: 97110-Therapeutic exercises, 97530- Therapeutic activity, 97112- Neuromuscular re-education,  97535- Self Care, and 02859- Manual therapy.  PLAN FOR NEXT SESSION: continue trunk mm retraining  Vernell Reges, PT, DPT, OCS  Aadhya Bustamante E Jamesha Ellsworth, PT 08/03/2024, 11:27 AM

## 2024-08-05 ENCOUNTER — Ambulatory Visit

## 2024-08-10 ENCOUNTER — Ambulatory Visit

## 2024-08-10 DIAGNOSIS — M5459 Other low back pain: Secondary | ICD-10-CM | POA: Diagnosis not present

## 2024-08-10 NOTE — Therapy (Signed)
 OUTPATIENT PHYSICAL THERAPY THORACOLUMBAR TREATMENT/Progress Note   Patient Name: Phillip Mcdaniel MRN: [id number] DOB:[birth year], 53 y.o., male Today's Date: 08/10/2024  END OF SESSION:  PT End of Session - 08/10/24 1125     Visit Number 10    Number of Visits 13    Date for Recertification  08/11/24    Authorization Type 2x/week x 6 weeks requested    PT Start Time 1120    PT Stop Time 1202    PT Time Calculation (min) 42 min    Activity Tolerance Patient tolerated treatment well    Behavior During Therapy WFL for tasks assessed/performed          Past Medical History:  Diagnosis Date   Anxiety    Arthritis    AVN (avascular necrosis of bone) (HCC)    RIGHT hip   Chronic neck pain    Depression    ED (erectile dysfunction)    Hepatitis    Hepatitis C s/p treatment wtih Mayvret   Hypertension    Lumbar spondylolysis    Past Surgical History:  Procedure Laterality Date   BACK SURGERY     anterior cervical   HIP SURGERY     RIGHT hip replacement   NECK SURGERY  2024   C3-6   Patient Active Problem List   Diagnosis Date Noted   Ganglion cyst of volar aspect of left wrist 07/29/2024   Lumbar foraminal stenosis 03/16/2024   Carpal tunnel syndrome 03/11/2024   Lumbar spondylosis 03/11/2024   S/P cervical spinal fusion 03/11/2024   Chronic hepatitis C without hepatic coma (HCC) 08/12/2021   Elevated liver enzymes 08/10/2021   Alcohol abuse 08/08/2021   Anxiety 08/08/2021   Chronic neck pain 08/08/2021   Hypertension 08/08/2021   Other male erectile dysfunction 08/08/2021   Tinnitus of both ears 08/08/2021   Vision problems 08/08/2021   Substance-induced psychotic disorder (HCC) 05/06/2020   Polysubstance abuse (HCC) 05/03/2020   Depression 05/03/2020    PCP: Dr. Sol, MD  REFERRING PROVIDER: Dr. Louis, MD  REFERRING DIAG:  Diagnosis  M54.16 (ICD-10-CM) - Radiculopathy, lumbar region    Rationale for Evaluation and Treatment:  Rehabilitation  THERAPY DIAG:  Other low back pain  ONSET DATE: May 2025- Lumbar fusion  SUBJECTIVE:                                                                                                                                                                                           SUBJECTIVE STATEMENT: Pt reports having lumbar fusion surgery in May 2025.  He reports the surgery went well.  He does still experience back pain-  lower and upper; having tingling in legs/knees, lower back pain; having charlie horses in his legs/cramps in his back (muscle spasms)  Pt is R hand dominant  Pt is not working now  Agg: prolonged standing and walking   Allev: hot tub  PERTINENT HISTORY:  Per chart review: bilateral L5-S1 decompressive lamintomies and foraminotomies and fusion as well as L5-S1 posterior lateral arthrodesis on 03/16/2024.  H/o R THA a few years ago too- due to AVN  Pt reports cervical fusion surgery in Feb 2025(?)- reports having neck pain/UE parasthesias intermittently L>R UE  PAIN:  Are you having pain? Yes 8/10 at worst, constant   PRECAUTIONS: None  RED FLAGS: None   WEIGHT BEARING RESTRICTIONS: No  FALLS:  Has patient fallen in last 6 months? No  LIVING ENVIRONMENT: Lives with: lives with their spouse Lives in: House/apartment Stairs: yes- few steps to enter  Has following equipment at home: none   OCCUPATION: pt is not working; disabled; not working any more; he just got a bass guitar to lean how to play and enjoys listening to music   PLOF: Independent  PATIENT GOALS:   NEXT MD VISIT: 08/20/24  OBJECTIVE:  Note: Objective measures were completed at Evaluation unless otherwise noted.  PATIENT SURVEYS:  mODI: 31/50  COGNITION: Overall cognitive status: Within functional limits for tasks assessed     SENSATION: WFL  MUSCLE LENGTH: (+) hamstring/gluteal mm tightness b/l  POSTURE: decreased lumbar lordosis  PALPATION: Incision site  is healed well; normal skin appearance at scar/surrounding soft tissue  LUMBAR ROM:   AROM eval  Flexion 50% limited  Extension 50% limited  Right lateral flexion 50% limited  Left lateral flexion 50% limited  Right rotation   Left rotation    (Blank rows = not tested)  LOWER EXTREMITY ROM:     Active  Right eval Left eval  Hip flexion 95 100  Hip extension    Hip abduction    Hip adduction    Hip internal rotation    Hip external rotation    Knee flexion    Knee extension    Ankle dorsiflexion    Ankle plantarflexion    Ankle inversion    Ankle eversion     (Blank rows = not tested)  LOWER EXTREMITY MMT:    MMT Right eval Left eval  Hip flexion 4 4+  Hip extension    Hip abduction    Hip adduction    Hip internal rotation    Hip external rotation    Knee flexion 5 5  Knee extension 5 5  Ankle dorsiflexion 5 5  Ankle plantarflexion    Ankle inversion    Ankle eversion     (Blank rows = not tested)   FUNCTIONAL TESTS:  Pt able to transfer sit to supine using transfer through sidelying/log roll technique  SLS without UE support x 5-10 sec R/L   GAIT: Pt able to amb without AD into/out of clinic today; amb with decreased speed; decreased stride length, decreased trunk rotation b/l  TREATMENT DATE: 08/10/24 Subjective: Pt reports overall his back is feeling about the same.  Scheduled for wrist surgery to remove ganglion 09/10/24. Functionally he is doing a little more around the house now.   Objective: Pain: 3-5/10 Hamstring mobility: normal b/l popliteal angle measurement (-) SLR b/l Pt demonstrates ability to perform squat with good hip hinge technique Hip abd/extension strength 4/5 b/l  Therapeutic Exercises:  Nustep x ..68 miles at level 4-5 today  Supine hooklying LTR x10 ea SKTC x 1 min ea- not today Sidelying shoulder abd x 3 with diaphragmatic breathing x 2 ea rep, R and L- not today Sidelying open book rotation stretch x 5 R and L- not  today Standing calf stretch: x 1 min ea- not today Standing counter top L stretch with hands on chair x 5   Therapeutic Activities: Standing abdominal brace with all therapeutic activities today Amb x 4 laps in hallway with upright posture Squat+ overhead press with 10 lbs weight ball: 2x 1 minute- not today Abdominal brace with pull/scapular retraction in standing: 40# nautilus- not today Abdominal brace with push in standing: 40 # nautilus - not today- not today Pallof press: 30 lbs nautilus 2x10 ea direction- not today Physioball behind back wall squat holds- 5 seconds, x 20- not today TA bracing on physioball: with marches x 20, with knee ext x 20 TA brace with bridge x 10 (add squeeze), x15 (hip abd with blue band)- not today Lateral band walk with blue t-band at ankles- 3 laps x 15 ft ea direction Standing SLS on dynadisc: 5x ea LE Bosu squats: 5 second holds x 15, 2 sets Standing hip extension- 5# 2x12 Standing hip abd- 5# 2x12 Standing marches- 5# 2x12   HEP instruction- see below- continue previous HEP; added abdominal brace + squat to HEP throughout his day                                                            PATIENT EDUCATION:  Education details: PT POC/goals, HEP instruction Person educated: Patient Education method: Explanation Education comprehension: verbalized understanding  HOME EXERCISE PROGRAM: Access Code: [email or web address] URL: [email or web address] Date: 07/06/2024 Prepared by: Vernell Reges  Exercises - Sidelying Thoracic Rotation with Open Book  - 1 x daily - 7 x weekly - 1 sets - 10 reps - Sidelying Shoulder Abduction  - 1 x daily - 7 x weekly - 1 sets - 10 reps - Supine Lower Trunk Rotation  - 1 x daily - 7 x weekly - 1 sets - 10 reps - Standing Gastroc Stretch  - 1 x daily - 7 x weekly - 3 sets - 30 seconds hold - Standing 'L' Stretch at Counter  - 1 x daily - 7 x weekly - 1 sets - 10 reps  ASSESSMENT:  CLINICAL  IMPRESSION: Patient arrived motivated to participate in PT today.  Overall, he is making progress towards PT goals.  Not reporting radicular sx; still working during this POC on improving lumbopelvic/trunk mm strength and endurance.  He continues to report localized central LBP; overall not worsening and not significantly improving from 3-5/10 pain level.  Functionally he is tolerating more therapeutic activities/exercises during PT session and is doing well with this course of post-op rehab.  Pt should continue to benefit from skilled PT to address impairments below and facilitate improved QOL and decreased pain.  OBJECTIVE IMPAIRMENTS: Abnormal gait, decreased activity tolerance, decreased balance, decreased mobility, decreased ROM, decreased strength, impaired flexibility, and pain.   ACTIVITY LIMITATIONS: carrying, lifting, bending, standing, squatting, and locomotion level  PARTICIPATION LIMITATIONS: cleaning, shopping, community activity, and yard work  PERSONAL FACTORS: Past/current experiences, Time since onset of injury/illness/exacerbation, and 1-2 comorbidities: s/p lumbar spine fusion, s/p cervical spine fusion, s/p R THA, see  other PMH in chart are also affecting patient's functional outcome.   REHAB POTENTIAL: Good  CLINICAL DECISION MAKING: Evolving/moderate complexity  EVALUATION COMPLEXITY: Moderate   GOALS: Goals reviewed with patient? Yes  SHORT TERM GOALS: Target date: 07/10/24  Pt will be instructed on HEP for LE/trunk flexibility and ROM and LE/trunk mm neuromuscular re-ed/strengthening Baseline:at visit #2; 10/13: has HEP Goal status: In progress   LONG TERM GOALS: Target date: 09/07/24  Improve mODI >10 points indicating pt significantly less impaired by his LBP during typical daily activities Baseline: 31/50; 10/13: in progress Goal status: INITIAL  2.  Improve Hip strength b/l 1/2 MMT to facilitate improved standing/walking tolerance to 1 hr for  housework/yardwork Baseline: 4/5 hip flex; 10/13: 4/5 30-45 min- in progress Goal status: INITIAL  3.  Pt will demonstrate ability to squat/lift/carry 20 lbs with neutral spine posture to facilitate being able to perform his daily housework/yardwork  Baseline: to be initiated at future visit 10/13: 10 lbs, in progress Goal status: INITIAL   PLAN:  PT FREQUENCY: 2x/week  PT DURATION: 4 weeks  PLANNED INTERVENTIONS: 97110-Therapeutic exercises, 97530- Therapeutic activity, 97112- Neuromuscular re-education, 97535- Self Care, and 02859- Manual therapy.  PLAN FOR NEXT SESSION: continue trunk mm retraining  Vernell Reges, PT, DPT, OCS  Vernell FORBES Reges, PT 08/10/2024, 11:26 AM

## 2024-08-12 ENCOUNTER — Ambulatory Visit

## 2024-08-12 DIAGNOSIS — M5459 Other low back pain: Secondary | ICD-10-CM

## 2024-08-13 NOTE — Therapy (Signed)
 OUTPATIENT PHYSICAL THERAPY THORACOLUMBAR TREATMENT   Patient Name: Phillip Mcdaniel MRN: [id number] DOB:[birth year], 53 y.o., male Today's Date: 08/13/2024  END OF SESSION:  PT End of Session - 08/12/24 1957     Visit Number 11    Number of Visits 15    Date for Recertification  10/04/24    Authorization Type PN/recert on 10/13    PT Start Time 1115    PT Stop Time 1200    PT Time Calculation (min) 45 min    Activity Tolerance Patient tolerated treatment well    Behavior During Therapy [facility] for tasks assessed/performed          Past Medical History:  Diagnosis Date   Anxiety    Arthritis    AVN (avascular necrosis of bone) (HCC)    RIGHT hip   Chronic neck pain    Depression    ED (erectile dysfunction)    Hepatitis    Hepatitis C s/p treatment wtih Mayvret   Hypertension    Lumbar spondylolysis    Past Surgical History:  Procedure Laterality Date   BACK SURGERY     anterior cervical   HIP SURGERY     RIGHT hip replacement   NECK SURGERY  2024   C3-6   Patient Active Problem List   Diagnosis Date Noted   Ganglion cyst of volar aspect of left wrist 07/29/2024   Lumbar foraminal stenosis 03/16/2024   Carpal tunnel syndrome 03/11/2024   Lumbar spondylosis 03/11/2024   S/P cervical spinal fusion 03/11/2024   Chronic hepatitis C without hepatic coma (HCC) 08/12/2021   Elevated liver enzymes 08/10/2021   Alcohol abuse 08/08/2021   Anxiety 08/08/2021   Chronic neck pain 08/08/2021   Hypertension 08/08/2021   Other male erectile dysfunction 08/08/2021   Tinnitus of both ears 08/08/2021   Vision problems 08/08/2021   Substance-induced psychotic disorder (HCC) 05/06/2020   Polysubstance abuse (HCC) 05/03/2020   Depression 05/03/2020    PCP: Dr. Sol, MD  REFERRING PROVIDER: Dr. Louis, MD  REFERRING DIAG:  Diagnosis  M54.16 (ICD-10-CM) - Radiculopathy, lumbar region    Rationale for Evaluation and Treatment: Rehabilitation  THERAPY DIAG:  Other  low back pain  ONSET DATE: May 2025- Lumbar fusion  SUBJECTIVE:                                                                                                                                                                                           SUBJECTIVE STATEMENT: Pt reports having lumbar fusion surgery in May 2025.  He reports the surgery went well.  He does still experience back pain- lower and upper;  having tingling in legs/knees, lower back pain; having charlie horses in his legs/cramps in his back (muscle spasms)  Pt is R hand dominant  Pt is not working now  Agg: prolonged standing and walking   Allev: hot tub  PERTINENT HISTORY:  Per chart review: bilateral L5-S1 decompressive lamintomies and foraminotomies and fusion as well as L5-S1 posterior lateral arthrodesis on 03/16/2024.  H/o R THA a few years ago too- due to AVN  Pt reports cervical fusion surgery in Feb 2025(?)- reports having neck pain/UE parasthesias intermittently L>R UE  PAIN:  Are you having pain? Yes 8/10 at worst, constant   PRECAUTIONS: None  RED FLAGS: None   WEIGHT BEARING RESTRICTIONS: No  FALLS:  Has patient fallen in last 6 months? No  LIVING ENVIRONMENT: Lives with: lives with their spouse Lives in: House/apartment Stairs: yes- few steps to enter  Has following equipment at home: none   OCCUPATION: pt is not working; disabled; not working any more; he just got a bass guitar to lean how to play and enjoys listening to music   PLOF: Independent  PATIENT GOALS:   NEXT MD VISIT: 08/20/24  OBJECTIVE:  Note: Objective measures were completed at Evaluation unless otherwise noted.  PATIENT SURVEYS:  mODI: 31/50  COGNITION: Overall cognitive status: Within functional limits for tasks assessed     SENSATION: WFL  MUSCLE LENGTH: (+) hamstring/gluteal mm tightness b/l  POSTURE: decreased lumbar lordosis  PALPATION: Incision site is healed well; normal skin appearance  at scar/surrounding soft tissue  LUMBAR ROM:   AROM eval  Flexion 50% limited  Extension 50% limited  Right lateral flexion 50% limited  Left lateral flexion 50% limited  Right rotation   Left rotation    (Blank rows = not tested)  LOWER EXTREMITY ROM:     Active  Right eval Left eval  Hip flexion 95 100  Hip extension    Hip abduction    Hip adduction    Hip internal rotation    Hip external rotation    Knee flexion    Knee extension    Ankle dorsiflexion    Ankle plantarflexion    Ankle inversion    Ankle eversion     (Blank rows = not tested)  LOWER EXTREMITY MMT:    MMT Right eval Left eval  Hip flexion 4 4+  Hip extension    Hip abduction    Hip adduction    Hip internal rotation    Hip external rotation    Knee flexion 5 5  Knee extension 5 5  Ankle dorsiflexion 5 5  Ankle plantarflexion    Ankle inversion    Ankle eversion     (Blank rows = not tested)   FUNCTIONAL TESTS:  Pt able to transfer sit to supine using transfer through sidelying/log roll technique  SLS without UE support x 5-10 sec R/L   GAIT: Pt able to amb without AD into/out of clinic today; amb with decreased speed; decreased stride length, decreased trunk rotation b/l  TREATMENT DATE: 08/12/24 Subjective: Pt reports overall his back is feeling about the same.  Scheduled for wrist surgery to remove ganglion 09/10/24. Functionally he is doing a little more around the house now.   Objective: Pain: 3-5/10 Hamstring mobility: normal b/l popliteal angle measurement (-) SLR b/l Pt demonstrates ability to perform squat with good hip hinge technique Hip abd/extension strength 4/5 b/l  Therapeutic Exercises:  Nustep x ..68 miles at level 4-5 today  Supine hooklying LTR  x10 ea SKTC x 1 min ea- not today Sidelying shoulder abd x 3 with diaphragmatic breathing x 2 ea rep, R and L- not today Sidelying open book rotation stretch x 5 R and L- not today Standing calf stretch: x 1 min  ea- not today Standing counter top L stretch with hands on chair x 5   Therapeutic Activities: Standing abdominal brace with all therapeutic activities today Amb x 4 laps in hallway with upright posture Squat+ overhead press with 10 lbs weight ball: 2x 1 minute- not today Abdominal brace with pull/scapular retraction in standing: 40# nautilus- not today Abdominal brace with push in standing: 40 # nautilus - not today- not today Pallof press: 30 lbs nautilus 2x10 ea direction- not today Physioball behind back wall squat holds- 5 seconds, x 20- not today TA bracing on physioball: with marches x 20, with knee ext x 20 TA brace with bridge x 10 TA bracing with 90/90 x15 TA bracing with alt leg extension from 90/10 x 10 ea, 2 sets Lateral band walk with blue t-band at ankles- 3 laps x 15 ft ea direction Standing SLS on dynadisc: 5x ea LE Bosu squats: 5 second holds x 15, 2 sets Standing hip extension- 5# 2x12 Standing hip abd- 5# 2x12 Standing marches- 5# 2x12   HEP instruction- see below- continue previous HEP; added abdominal brace + squat to HEP throughout his day                                                            PATIENT EDUCATION:  Education details: PT POC/goals, HEP instruction Person educated: Patient Education method: Explanation Education comprehension: verbalized understanding  HOME EXERCISE PROGRAM: Access Code: [email or web address] URL: [email or web address] Date: 07/06/2024 Prepared by: Vernell Reges  Exercises - Sidelying Thoracic Rotation with Open Book  - 1 x daily - 7 x weekly - 1 sets - 10 reps - Sidelying Shoulder Abduction  - 1 x daily - 7 x weekly - 1 sets - 10 reps - Supine Lower Trunk Rotation  - 1 x daily - 7 x weekly - 1 sets - 10 reps - Standing Gastroc Stretch  - 1 x daily - 7 x weekly - 3 sets - 30 seconds hold - Standing 'L' Stretch at Counter  - 1 x daily - 7 x weekly - 1 sets - 10 reps  ASSESSMENT:  CLINICAL IMPRESSION: Patient  arrived motivated to participate in PT today.  Continued to work on lumbopelvic/trunk mm retraining without using L UE as his L wrist ganglion is painful and limits grip function and wrist extension in CKC positoin.  Functionally he is tolerating more therapeutic activities/exercises during PT session and is doing well with this course of post-op rehab.  Pt should continue to benefit from skilled PT to address impairments below and facilitate improved QOL and decreased pain.  OBJECTIVE IMPAIRMENTS: Abnormal gait, decreased activity tolerance, decreased balance, decreased mobility, decreased ROM, decreased strength, impaired flexibility, and pain.   ACTIVITY LIMITATIONS: carrying, lifting, bending, standing, squatting, and locomotion level  PARTICIPATION LIMITATIONS: cleaning, shopping, community activity, and yard work  PERSONAL FACTORS: Past/current experiences, Time since onset of injury/illness/exacerbation, and 1-2 comorbidities: s/p lumbar spine fusion, s/p cervical spine fusion, s/p R THA, see other PMH in chart are also affecting patient's functional outcome.  REHAB POTENTIAL: Good  CLINICAL DECISION MAKING: Evolving/moderate complexity  EVALUATION COMPLEXITY: Moderate   GOALS: Goals reviewed with patient? Yes  SHORT TERM GOALS: Target date: 07/10/24  Pt will be instructed on HEP for LE/trunk flexibility and ROM and LE/trunk mm neuromuscular re-ed/strengthening Baseline:at visit #2; 10/13: has HEP Goal status: In progress   LONG TERM GOALS: Target date: 09/07/24  Improve mODI >10 points indicating pt significantly less impaired by his LBP during typical daily activities Baseline: 31/50; 10/13: in progress Goal status: INITIAL  2.  Improve Hip strength b/l 1/2 MMT to facilitate improved standing/walking tolerance to 1 hr for housework/yardwork Baseline: 4/5 hip flex; 10/13: 4/5 30-45 min- in progress Goal status: INITIAL  3.  Pt will demonstrate ability to squat/lift/carry  20 lbs with neutral spine posture to facilitate being able to perform his daily housework/yardwork  Baseline: to be initiated at future visit 10/13: 10 lbs, in progress Goal status: INITIAL   PLAN:  PT FREQUENCY: 2x/week  PT DURATION: 4 weeks  PLANNED INTERVENTIONS: 97110-Therapeutic exercises, 97530- Therapeutic activity, 97112- Neuromuscular re-education, 97535- Self Care, and [id number]- Manual therapy.  PLAN FOR NEXT SESSION: continue trunk mm retraining  Vernell Reges, PT, DPT, OCS  Ronique Simerly E Jaylei Fuerte, PT 08/13/2024, 8:00 PM

## 2024-08-17 ENCOUNTER — Ambulatory Visit

## 2024-08-17 DIAGNOSIS — M5459 Other low back pain: Secondary | ICD-10-CM

## 2024-08-17 NOTE — Therapy (Signed)
 OUTPATIENT PHYSICAL THERAPY THORACOLUMBAR TREATMENT   Patient Name: Phillip Mcdaniel MRN: [id number] DOB:12-09-1970, 53 y.o., male Today's Date: 08/17/2024  END OF SESSION:  PT End of Session - 08/17/24 1054     Visit Number 12    PT Start Time 1055    PT Stop Time 1140    PT Time Calculation (min) 45 min          Past Medical History:  Diagnosis Date   Anxiety    Arthritis    AVN (avascular necrosis of bone) (HCC)    RIGHT hip   Chronic neck pain    Depression    ED (erectile dysfunction)    Hepatitis    Hepatitis C s/p treatment wtih Mayvret   Hypertension    Lumbar spondylolysis    Past Surgical History:  Procedure Laterality Date   BACK SURGERY     anterior cervical   HIP SURGERY     RIGHT hip replacement   NECK SURGERY  2024   C3-6   Patient Active Problem List   Diagnosis Date Noted   Ganglion cyst of volar aspect of left wrist 07/29/2024   Lumbar foraminal stenosis 03/16/2024   Carpal tunnel syndrome 03/11/2024   Lumbar spondylosis 03/11/2024   S/P cervical spinal fusion 03/11/2024   Chronic hepatitis C without hepatic coma (HCC) 08/12/2021   Elevated liver enzymes 08/10/2021   Alcohol abuse 08/08/2021   Anxiety 08/08/2021   Chronic neck pain 08/08/2021   Hypertension 08/08/2021   Other male erectile dysfunction 08/08/2021   Tinnitus of both ears 08/08/2021   Vision problems 08/08/2021   Substance-induced psychotic disorder (HCC) 05/06/2020   Polysubstance abuse (HCC) 05/03/2020   Depression 05/03/2020    PCP: Dr. Sol, MD  REFERRING PROVIDER: Dr. Louis, MD  REFERRING DIAG:  Diagnosis  M54.16 (ICD-10-CM) - Radiculopathy, lumbar region    Rationale for Evaluation and Treatment: Rehabilitation  THERAPY DIAG:  Other low back pain  ONSET DATE: May 2025- Lumbar fusion  SUBJECTIVE:                                                                                                                                                                                            SUBJECTIVE STATEMENT: Pt reports having lumbar fusion surgery in May 2025.  He reports the surgery went well.  He does still experience back pain- lower and upper; having tingling in legs/knees, lower back pain; having charlie horses in his legs/cramps in his back (muscle spasms)  Pt is R hand dominant  Pt is not working now  Agg: prolonged standing and walking   Allev: hot tub  PERTINENT HISTORY:  Per chart review: bilateral L5-S1 decompressive lamintomies and foraminotomies and fusion as well as L5-S1 posterior lateral arthrodesis on 03/16/2024.  H/o R THA a few years ago too- due to AVN  Pt reports cervical fusion surgery in Feb 2025(?)- reports having neck pain/UE parasthesias intermittently L>R UE  PAIN:  Are you having pain? Yes 8/10 at worst, constant   PRECAUTIONS: None  RED FLAGS: None   WEIGHT BEARING RESTRICTIONS: No  FALLS:  Has patient fallen in last 6 months? No  LIVING ENVIRONMENT: Lives with: lives with their spouse Lives in: House/apartment Stairs: yes- few steps to enter  Has following equipment at home: none   OCCUPATION: pt is not working; disabled; not working any more; he just got a bass guitar to lean how to play and enjoys listening to music   PLOF: Independent  PATIENT GOALS:   NEXT MD VISIT: 08/20/24  OBJECTIVE:  Note: Objective measures were completed at Evaluation unless otherwise noted.  PATIENT SURVEYS:  mODI: 31/50  COGNITION: Overall cognitive status: Within functional limits for tasks assessed     SENSATION: WFL  MUSCLE LENGTH: (+) hamstring/gluteal mm tightness b/l  POSTURE: decreased lumbar lordosis  PALPATION: Incision site is healed well; normal skin appearance at scar/surrounding soft tissue  LUMBAR ROM:   AROM eval  Flexion 50% limited  Extension 50% limited  Right lateral flexion 50% limited  Left lateral flexion 50% limited  Right rotation   Left rotation    (Blank  rows = not tested)  LOWER EXTREMITY ROM:     Active  Right eval Left eval  Hip flexion 95 100  Hip extension    Hip abduction    Hip adduction    Hip internal rotation    Hip external rotation    Knee flexion    Knee extension    Ankle dorsiflexion    Ankle plantarflexion    Ankle inversion    Ankle eversion     (Blank rows = not tested)  LOWER EXTREMITY MMT:    MMT Right eval Left eval  Hip flexion 4 4+  Hip extension    Hip abduction    Hip adduction    Hip internal rotation    Hip external rotation    Knee flexion 5 5  Knee extension 5 5  Ankle dorsiflexion 5 5  Ankle plantarflexion    Ankle inversion    Ankle eversion     (Blank rows = not tested)   FUNCTIONAL TESTS:  Pt able to transfer sit to supine using transfer through sidelying/log roll technique  SLS without UE support x 5-10 sec R/L   GAIT: Pt able to amb without AD into/out of clinic today; amb with decreased speed; decreased stride length, decreased trunk rotation b/l  TREATMENT DATE: 08/17/24 Subjective: Pt reports overall his back is feeling about the same.  Had a good weekend overall.  No new concerns with his back today.    Objective: Pain: 3-5/10 Hamstring mobility: normal b/l popliteal angle measurement (-) SLR b/l Pt demonstrates ability to perform squat with good hip hinge technique Hip abd/extension strength 4/5 b/l  Therapeutic Exercises:  Nustep x .72 miles for conditioning/endurance Supine hooklying LTR x10 ea- not today SKTC x 1 min ea- not today Sidelying shoulder abd x 3 with diaphragmatic breathing x 2 ea rep, R and L- not today Sidelying open book rotation stretch x 5 R and L- not today Standing calf stretch: x 1 min ea- not today Standing  counter top L stretch with hands on chair x 5- not today   Therapeutic Activities: Standing abdominal brace with all therapeutic activities today Amb x 4 laps in hallway with upright posture Squat+ overhead press with 10 lbs  weight ball: 2x 1 minute- not today Abdominal brace with pull/scapular retraction in standing: 40# nautilus- not today Abdominal brace with push in standing: 40 # nautilus - not today- not today Pallof press: 30 lbs nautilus 2x10 ea direction- not today Physioball behind back wall squat holds- 5 seconds, x 20- not today TA bracing on physioball: with marches x 20, with knee ext x 20 TA brace with bridge x 10 TA bracing with 90/90 x15 TA bracing with alt leg extension from 90/10 x 10 ea, 2 sets Lateral band walk with blue t-band at ankles- 3 laps x 15 ft ea direction Standing SLS on dynadisc: 5x ea LE Bosu squats: 5 second holds x 15, 2 sets- not today Medicine ball front squats 3x12 (8 lbs) Standing hip extension- 5# 2x12 Standing hip abd- 5# 2x12 Standing marches- 5# 2x12   HEP instruction- see below- continue previous HEP; added abdominal brace + squat to HEP throughout his day                                                            PATIENT EDUCATION:  Education details: PT POC/goals, HEP instruction Person educated: Patient Education method: Explanation Education comprehension: verbalized understanding  HOME EXERCISE PROGRAM: Access Code: [email or web address] URL: [email or web address] Date: 07/06/2024 Prepared by: Vernell Reges  Exercises - Sidelying Thoracic Rotation with Open Book  - 1 x daily - 7 x weekly - 1 sets - 10 reps - Sidelying Shoulder Abduction  - 1 x daily - 7 x weekly - 1 sets - 10 reps - Supine Lower Trunk Rotation  - 1 x daily - 7 x weekly - 1 sets - 10 reps - Standing Gastroc Stretch  - 1 x daily - 7 x weekly - 3 sets - 30 seconds hold - Standing 'L' Stretch at Counter  - 1 x daily - 7 x weekly - 1 sets - 10 reps  ASSESSMENT:  CLINICAL IMPRESSION: Patient arrived motivated to participate in PT today.  Continued to work on lumbopelvic/trunk mm retraining without using L UE as his L wrist ganglion is painful and limits grip function and wrist  extension in CKC positoin.  Functionally he is tolerating more therapeutic activities/exercises during PT session and is doing well with this course of post-op rehab.  No report of LE parasthesias or increase in LBP with strengthening therapeutic activities today.  Pt should continue to benefit from skilled PT to address impairments below and facilitate improved QOL and decreased pain.  OBJECTIVE IMPAIRMENTS: Abnormal gait, decreased activity tolerance, decreased balance, decreased mobility, decreased ROM, decreased strength, impaired flexibility, and pain.   ACTIVITY LIMITATIONS: carrying, lifting, bending, standing, squatting, and locomotion level  PARTICIPATION LIMITATIONS: cleaning, shopping, community activity, and yard work  PERSONAL FACTORS: Past/current experiences, Time since onset of injury/illness/exacerbation, and 1-2 comorbidities: s/p lumbar spine fusion, s/p cervical spine fusion, s/p R THA, see other PMH in chart are also affecting patient's functional outcome.   REHAB POTENTIAL: Good  CLINICAL DECISION MAKING: Evolving/moderate complexity  EVALUATION COMPLEXITY: Moderate   GOALS: Goals reviewed with patient? Yes  SHORT TERM GOALS: Target date: 07/10/24  Pt will be instructed on HEP for LE/trunk flexibility and ROM and LE/trunk mm neuromuscular re-ed/strengthening Baseline:at visit #2; 10/13: has HEP Goal status: In progress   LONG TERM GOALS: Target date: 09/07/24  Improve mODI >10 points indicating pt significantly less impaired by his LBP during typical daily activities Baseline: 31/50; 10/13: in progress Goal status: INITIAL  2.  Improve Hip strength b/l 1/2 MMT to facilitate improved standing/walking tolerance to 1 hr for housework/yardwork Baseline: 4/5 hip flex; 10/13: 4/5 30-45 min- in progress Goal status: INITIAL  3.  Pt will demonstrate ability to squat/lift/carry 20 lbs with neutral spine posture to facilitate being able to perform his daily  housework/yardwork  Baseline: to be initiated at future visit 10/13: 10 lbs, in progress Goal status: INITIAL   PLAN:  PT FREQUENCY: 2x/week  PT DURATION: 4 weeks  PLANNED INTERVENTIONS: 97110-Therapeutic exercises, 97530- Therapeutic activity, 97112- Neuromuscular re-education, 97535- Self Care, and 02859- Manual therapy.  PLAN FOR NEXT SESSION: continue trunk mm retraining, next visit progress note before apt with surgeon  Vernell Reges, PT, DPT, OCS  Verley Pariseau E Averiana Clouatre, PT 08/17/2024, 10:55 AM

## 2024-08-18 LAB — COLOGUARD: COLOGUARD: NEGATIVE

## 2024-08-19 ENCOUNTER — Ambulatory Visit

## 2024-08-19 ENCOUNTER — Ambulatory Visit: Payer: Self-pay | Admitting: Family Medicine

## 2024-08-19 NOTE — Progress Notes (Signed)
 Your cologaurd test is negative, recommend recheck in 3 years. If you notice any rectal bleeding, changes in stool please follow up in office.   Dr.K

## 2024-09-10 ENCOUNTER — Ambulatory Visit (INDEPENDENT_AMBULATORY_CARE_PROVIDER_SITE_OTHER): Admitting: Orthopedic Surgery

## 2024-09-10 ENCOUNTER — Ambulatory Visit

## 2024-09-10 ENCOUNTER — Encounter: Payer: Self-pay | Admitting: Orthopedic Surgery

## 2024-09-10 DIAGNOSIS — M25532 Pain in left wrist: Secondary | ICD-10-CM | POA: Diagnosis not present

## 2024-09-10 DIAGNOSIS — R202 Paresthesia of skin: Secondary | ICD-10-CM

## 2024-09-10 DIAGNOSIS — M67432 Ganglion, left wrist: Secondary | ICD-10-CM | POA: Diagnosis not present

## 2024-09-10 NOTE — Progress Notes (Signed)
 New Patient Visit  Assessment: Phillip Mcdaniel is a 53 y.o. male with the following: 1. Ganglion cyst of volar aspect of left wrist 2. Paresthesia of skin   Plan: Phillip Mcdaniel has some pain in the volar wrist, as well as numbness and tingling in the median nerve distribution.  He has a large volar ganglion cyst.  Previous ultrasound shows that it was abutting the radial artery.  As such, I have deferred aspiration.  I would like to obtain an MRI of the left wrist in order to further evaluate the presence of the cyst.  It is possible that this is also contributing to the numbness and tingling he is experiencing in his fingers.  This was discussed with the patient in clinic today.  He states understanding.  We have ordered an MRI, and we will see him in clinic once the results are available for  Follow-up: Return for After MRI.  Subjective:  Chief Complaint  Patient presents with   Left wrist pain    History of Present Illness: Phillip Mcdaniel is a 53 y.o. male who has been referred by Selinda Hoots, MD for evaluation of left wrist pain.  He is right-hand dominant.  Over the past couple months, he has noticed swelling on the volar aspect of the left wrist.  Dr. Alvia used ultrasound and confirmed the presence of a ganglion cyst, which was abutting the radial artery.  As such, he was referred to clinic.  He is also having numbness and tingling, especially in the long and ring fingers.  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  There is swelling in the volar wrist is also starting to cause some pain.   Review of Systems: No fevers or chills + numbness or tingling No chest pain No shortness of breath No bowel or bladder dysfunction No GI distress No headaches   Medical History:  Past Medical History:  Diagnosis Date   Anxiety    Arthritis    AVN (avascular necrosis of bone) (HCC)    RIGHT hip   Chronic neck pain    Depression    ED (erectile dysfunction)     Hepatitis    Hepatitis C s/p treatment wtih Mayvret   Hypertension    Lumbar spondylolysis     Past Surgical History:  Procedure Laterality Date   BACK SURGERY     anterior cervical   HIP SURGERY     RIGHT hip replacement   NECK SURGERY  2024   C3-6    Family History  Problem Relation Age of Onset   Hypertension Mother    Hypertension Father    Heart disease Father    Hypertension Sister    Depression Sister    Hypertension Maternal Grandmother    Hypertension Maternal Grandfather    Hypertension Paternal Grandmother    Hypertension Paternal Grandfather    Social History   Tobacco Use   Smoking status: Never   Smokeless tobacco: Never  Vaping Use   Vaping status: Never Used  Substance Use Topics   Alcohol use: Yes    Comment: daily, at least 2 beers a day.   Drug use: Not Currently    Types: Cocaine, Marijuana, Methamphetamines    No Known Allergies  Current Meds  Medication Sig   acetaminophen  (TYLENOL ) 500 MG tablet Take 500-1,000 mg by mouth every 6 (six) hours as needed (pain.).   amLODipine  (NORVASC ) 5 MG tablet Take 1 tablet (5 mg total) by mouth  daily.   lisinopril  (ZESTRIL ) 10 MG tablet Take 1 tablet (10 mg total) by mouth daily.    Objective: There were no vitals taken for this visit.  Physical Exam:  General: Alert and oriented. and No acute distress. Gait: Normal gait.  Evaluation left wrist demonstrates a growth on the volar aspect, in line with the radial artery.  Negative Tinel's over the growth.  It is firm but compressible.  Overlying skin is mobile.  He also has decreased sensation within the median nerve distribution.  Positive Tinel's at the carpal tunnel.  Positive Phalen's.  IMAGING: I personally ordered and reviewed the following images  X-rays of the left wrist were obtained in clinic today.  No acute injuries are noted.  Well-maintained joint space.  No dislocation.  No bony lesions.  Soft tissue swelling is appreciated over the  volar wrist.  Impression: Negative left wrist x-ray   New Medications:  No orders of the defined types were placed in this encounter.     Oneil DELENA Horde, MD  09/10/2024 9:29 AM

## 2024-09-11 ENCOUNTER — Encounter: Payer: Self-pay | Admitting: Orthopedic Surgery

## 2024-09-14 ENCOUNTER — Encounter: Payer: Self-pay | Admitting: Family Medicine

## 2024-09-14 ENCOUNTER — Telehealth: Payer: Self-pay

## 2024-09-14 NOTE — Telephone Encounter (Signed)
 Copied from CRM #[id number]. Topic: General - Other >> Sep 14, 2024 10:18 AM Thersia BROCKS wrote: Reason for CRM: Patient called in stated he needs the forms to get filled out for his handicapped sticker . Would like a callback on if he needs to come in or if that could be completed

## 2024-09-14 NOTE — Telephone Encounter (Signed)
 Why is he requesting a placard?

## 2024-09-14 NOTE — Telephone Encounter (Signed)
 Please review if it appropriate to complete form or needs an appointment

## 2024-09-15 NOTE — Telephone Encounter (Signed)
 Spoke with patient, he is requesting the handicap place card because he is severely limited in his ability to walk long distances, has had 2 back surgeries this year.

## 2024-09-15 NOTE — Telephone Encounter (Signed)
 Okay to check the paper work, no visit needed.

## 2024-09-15 NOTE — Telephone Encounter (Signed)
 Handicap form completed. Patient would like it mailed to his home address.

## 2024-09-21 ENCOUNTER — Telehealth: Payer: Self-pay | Admitting: Family Medicine

## 2024-09-21 NOTE — Telephone Encounter (Signed)
 Copied from CRM #[id number]. Topic: General - Other >> Sep 21, 2024  2:58 PM Antony RAMAN wrote: Reason for CRM: patient has paperwork for dental treatment and needs pts bp to go down today it was 171/109 . Please advise pt when he can drop off paperwork. Callback- [phone]

## 2024-09-21 NOTE — Telephone Encounter (Signed)
 Yes please

## 2024-09-21 NOTE — Telephone Encounter (Signed)
Would you like for patient to schedule an appointment?

## 2024-09-22 ENCOUNTER — Ambulatory Visit
Admission: RE | Admit: 2024-09-22 | Discharge: 2024-09-22 | Disposition: A | Discharge status: Home / Self Care | Source: Ambulatory Visit | Attending: Orthopedic Surgery | Admitting: Orthopedic Surgery

## 2024-09-22 DIAGNOSIS — M67432 Ganglion, left wrist: Secondary | ICD-10-CM

## 2024-09-23 NOTE — Telephone Encounter (Signed)
Spoke with patient. Appointment has been scheduled

## 2024-10-02 ENCOUNTER — Encounter: Payer: Self-pay | Admitting: Family Medicine

## 2024-10-02 ENCOUNTER — Ambulatory Visit: Admitting: Family Medicine

## 2024-10-02 ENCOUNTER — Other Ambulatory Visit (HOSPITAL_COMMUNITY): Payer: Self-pay

## 2024-10-02 VITALS — BP 138/88 | HR 60 | Ht 67.0 in | Wt 184.0 lb

## 2024-10-02 DIAGNOSIS — R011 Cardiac murmur, unspecified: Secondary | ICD-10-CM | POA: Diagnosis not present

## 2024-10-02 DIAGNOSIS — I10 Essential (primary) hypertension: Secondary | ICD-10-CM

## 2024-10-02 MED ORDER — AMLODIPINE BESYLATE 5 MG PO TABS
5.0000 mg | ORAL_TABLET | Freq: Every day | ORAL | 3 refills | Status: DC
  Filled 2024-10-02: qty 90, 90d supply, fill #0

## 2024-10-02 MED ORDER — LISINOPRIL 20 MG PO TABS: 20.0000 mg | ORAL_TABLET | Freq: Every day | ORAL | 3 refills | Status: AC

## 2024-10-02 MED ORDER — AMLODIPINE BESYLATE 5 MG PO TABS: 5.0000 mg | ORAL_TABLET | Freq: Every day | ORAL | 3 refills | Status: AC

## 2024-10-02 MED ORDER — LISINOPRIL 20 MG PO TABS
20.0000 mg | ORAL_TABLET | Freq: Every day | ORAL | 3 refills | Status: DC
  Filled 2024-10-02: qty 90, 90d supply, fill #0

## 2024-10-02 NOTE — Progress Notes (Signed)
 Established Patient Office Visit  Patient ID: Phillip Mcdaniel, male    DOB: [birth year]  Age: 53 y.o. MRN: [id number] PCP: Lalana Wachter K, MD  Chief Complaint  Patient presents with   Hypertension    Subjective:     HPI  Discussed the use of AI scribe software for clinical note transcription with the patient, who gave verbal consent to proceed.  History of Present Illness Phillip Mcdaniel is a 53 year old male with hypertension who presents with elevated blood pressure readings.  He has been experiencing elevated blood pressure readings, with a recent measurement of 171/109 taken at his dentist's office. He notes that this reading was taken at the wrist, which he believes may have contributed to the higher result. Today, his blood pressure is 138/88. He mentions increased tension at home due to arguments and additional responsibilities, which he feels may have contributed to his elevated blood pressure.  He confirms adherence to his prescribed medications, taking them daily. He is currently on lisinopril  and reports being low on his medication, planning to contact his pharmacy for a refill.  He discusses a separate issue with his left wrist, which has been causing him pain and numbness. He underwent an MRI, which showed growth around his vascular arteries. An ultrasound had previously noted growth in the area.  No shortness of breath, chest pain, or syncope. He mentions a sensation in his chest described as a 'double little boop' and occasional discomfort when eating, feeling as though he hasn't chewed enough.      Review of Systems  All other systems reviewed and are negative.     Objective:     BP 138/88   Pulse 60   Ht 5' 7 (1.702 m)   Wt 184 lb (83.5 kg)   SpO2 98%   BMI 28.82 kg/m     Physical Exam Vitals and nursing note reviewed.  Constitutional:      Appearance: Normal appearance.  HENT:     Head: Normocephalic.     Right Ear: External ear normal.      Left Ear: External ear normal.  Eyes:     Conjunctiva/sclera: Conjunctivae normal.  Cardiovascular:     Rate and Rhythm: Normal rate.  Pulmonary:     Effort: Pulmonary effort is normal. No respiratory distress.  Abdominal:     Palpations: Abdomen is soft.  Musculoskeletal:        General: Normal range of motion.  Skin:    General: Skin is warm.  Neurological:     Mental Status: He is alert and oriented to person, place, and time.  Psychiatric:        Mood and Affect: Mood normal.     Physical Exam VITALS: BP- 138/88   No results found for any visits on 10/02/24.     The ASCVD Risk score (Arnett DK, et al., 2019) failed to calculate for the following reasons:   Cannot find a previous HDL lab   Cannot find a previous total cholesterol lab    Assessment & Plan:   Problem List Items Addressed This Visit   None Visit Diagnoses       Uncontrolled hypertension    -  Primary   Relevant Medications   lisinopril  (ZESTRIL ) 20 MG tablet   amLODipine  (NORVASC ) 5 MG tablet     Systolic murmur       Relevant Orders   ECHOCARDIOGRAM COMPLETE       Assessment and Plan Assessment &  Plan Essential hypertension Blood pressure elevated; lisinopril  increased to 20 mg for better control. Discussed side effects and advised monitoring. - Increased lisinopril  from 10 mg to 20 mg daily. - Instructed to monitor blood pressure and report if it remains high. - Switched pharmacy to [facility] for medication pickup.  Cardiac murmur Possible systolic murmur due to valve calcification, exacerbated by hypertension. Further evaluation needed. - Ordered an ultrasound of the heart to evaluate the murmur.    No follow-ups on file.    Vinary K Helmut Hennon, MD [facility] Primary Care & Sports Medicine at [facility]

## 2024-10-08 ENCOUNTER — Encounter: Payer: Self-pay | Admitting: Orthopedic Surgery

## 2024-10-08 ENCOUNTER — Ambulatory Visit: Admitting: Orthopedic Surgery

## 2024-10-08 DIAGNOSIS — R202 Paresthesia of skin: Secondary | ICD-10-CM

## 2024-10-08 DIAGNOSIS — M67432 Ganglion, left wrist: Secondary | ICD-10-CM

## 2024-10-08 NOTE — Progress Notes (Signed)
 Return patient Visit  Assessment: Phillip Mcdaniel is a 53 y.o. male with the following: 1. Ganglion cyst of volar aspect of left wrist 2. Paresthesia of skin   Plan: Phillip Mcdaniel has a large volar ganglion cyst in the left wrist.  It is in close proximity to the radial artery, which was confirmed with MRI.  He is also having numbness and tingling, which could be consistent with some carpal tunnel syndrome.  I was able to palpate the artery, which was lateral to the cyst.  As such, I recommended aspiration in clinic today.  If the cyst returns, or he is interested in surgery, we would likely have to refer him to a hand specialist, once again due to the proximity to the radial artery.  He states understanding.  He tolerated the aspiration today.  We placed a pressure dressing.  He will return to clinic as needed.  Procedure note - Ganglion Cyst Aspiration -volar Left Wrist   Verbal consent was obtained to aspirate a ganglion cyst  Timeout was completed to confirm the site of aspiration. Radial artery was palpated lateral to the cyst The skin was prepped with alcohol and ethyl chloride was sprayed at the injection site.  An 18-gauge needle was used to aspirate 5 cc of thick gelatinous cyst material. Hemostasis was achieved.  No pulsatile bleeding There were no complications.  A pressure bandage was applied.   Follow-up: Return if symptoms worsen or fail to improve.  Subjective:  Chief Complaint  Patient presents with   Left wrist pain    MRI review    History of Present Illness: Phillip Mcdaniel is a 53 y.o. male who returns to clinic for repeat evaluation of left wrist pain.  I saw him in clinic recently, as he has a volar ganglion cyst.  Proximity to the radial artery justified an MRI.  He is here to discuss the findings.  He continues to have numbness and tingling in the long and ring finger.  Review of Systems: No fevers or chills + numbness or tingling No chest  pain No shortness of breath No bowel or bladder dysfunction No GI distress No headaches    Objective: There were no vitals taken for this visit.  Physical Exam:  General: Alert and oriented. and No acute distress. Gait: Normal gait.  Evaluation left wrist demonstrates a growth on the volar aspect, in line with the radial artery.  Negative Tinel's over the growth.  It is firm but compressible.  Overlying skin is mobile.  He also has decreased sensation within the median nerve distribution.  Positive Tinel's at the carpal tunnel.  Positive Phalen's.  IMAGING: I personally ordered and reviewed the following images  MRI left wrist  IMPRESSION: 1. Large septated ganglion cyst along the volar aspect of the distal radius, measuring approximately 2.4 x 2.0 x 1.4 cm, intimately associated with the radial artery. 2. Probable additional ganglion cyst along the volar aspect of the distal carpal row between the trapezium and trapezoid, measuring up to 1.0 cm. 3. Moderate degenerative changes at the first carpometacarpal articulation. No acute osseous findings. 4. No significant wrist tendon or ligament abnormalities.   New Medications:  No orders of the defined types were placed in this encounter.     Oneil DELENA Horde, MD  10/08/2024 10:21 AM

## 2024-11-02 ENCOUNTER — Ambulatory Visit: Admitting: Family Medicine

## 2024-11-02 ENCOUNTER — Encounter: Payer: Self-pay | Admitting: Family Medicine

## 2024-11-02 VITALS — BP 132/88 | HR 74 | Ht 67.0 in | Wt 181.0 lb

## 2024-11-02 DIAGNOSIS — Z131 Encounter for screening for diabetes mellitus: Secondary | ICD-10-CM | POA: Diagnosis not present

## 2024-11-02 DIAGNOSIS — Z136 Encounter for screening for cardiovascular disorders: Secondary | ICD-10-CM

## 2024-11-02 DIAGNOSIS — Z13220 Encounter for screening for lipoid disorders: Secondary | ICD-10-CM

## 2024-11-02 DIAGNOSIS — Z130 Encounter for screening for diseases of the blood and blood-forming organs and certain disorders involving the immune mechanism: Secondary | ICD-10-CM | POA: Diagnosis not present

## 2024-11-02 DIAGNOSIS — Z125 Encounter for screening for malignant neoplasm of prostate: Secondary | ICD-10-CM

## 2024-11-02 DIAGNOSIS — I10 Essential (primary) hypertension: Secondary | ICD-10-CM | POA: Diagnosis not present

## 2024-11-02 NOTE — Progress Notes (Signed)
 "  Established Patient Office Visit  Patient ID: Phillip Mcdaniel, male    DOB: January 09, 1971  Age: 54 y.o. MRN: [id number] PCP: Sparrow Siracusa K, MD  Chief Complaint  Patient presents with   Hypertension   Hip Pain    Would like to see Dr Alvia    Subjective:     HPI  Discussed the use of AI scribe software for clinical note transcription with the patient, who gave verbal consent to proceed.  History of Present Illness Phillip Mcdaniel is a 54 year old male presents for HTN follow up.  He has been experiencing ongoing issues with his hip and knee, which have been significantly troubling him. He previously consulted with Dr. Alvia regarding these issues. He mentions having had a hip replacement, but it is bothering him again. He has a history of two back surgeries in the past year, and there was no guarantee that further surgeries would not be needed.  He discusses a ganglion cyst issue in his left  wrist, which was evaluated by Dr. Myrna. An MRI revealed that the nerve is 'intimately entwined,' making surgical intervention challenging. He was advised to see a hand specialist due to the risk of losing finger function.  He has a history of degenerative disc disease and osteoporosis. 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 He has a GED and has worked in physically demanding jobs throughout his life.  He is currently on lisinopril , which was increased from 10 mg to 20 mg, and amlodipine  5 mg for blood pressure management. His blood pressure has improved, with recent readings showing 132/88 mmHg. He has not been regularly checking his blood pressure at home but plans to start doing so.  He mentions receiving a letter indicating that his recent colonoscopy was normal, and he is due for another in three years. He also has an upcoming cardiology appointment on the 16th of the month.      Review of Systems  All other systems  reviewed and are negative.     Objective:     BP 132/88   Pulse 74   Ht 5' 7 (1.702 m)   Wt 181 lb (82.1 kg)   SpO2 96%   BMI 28.35 kg/m     Physical Exam Vitals and nursing note reviewed.  Constitutional:      Appearance: Normal appearance.  HENT:     Head: Normocephalic.     Right Ear: External ear normal.     Left Ear: External ear normal.  Eyes:     Conjunctiva/sclera: Conjunctivae normal.  Cardiovascular:     Rate and Rhythm: Normal rate.  Pulmonary:     Effort: Pulmonary effort is normal. No respiratory distress.  Abdominal:     Palpations: Abdomen is soft.  Musculoskeletal:        General: Normal range of motion.  Skin:    General: Skin is warm.  Neurological:     Mental Status: He is alert and oriented to person, place, and time.  Psychiatric:        Mood and Affect: Mood normal.     Physical Exam VITALS: BP- 132/88   No results found for any visits on 11/02/24.     The ASCVD Risk score (Arnett DK, et al., 2019) failed to calculate for the following reasons:   Cannot find a previous HDL lab   Cannot find a previous total cholesterol lab   * -  Cholesterol units were assumed    Assessment & Plan:   Problem List Items Addressed This Visit   None   Assessment and Plan Assessment & Plan Essential hypertension Hypertension improving with current medication. Not monitoring BP at home but willing to start. - Continue lisinopril  20 mg oral daily. - Continue amlodipine  5 mg oral daily. - Encouraged home blood pressure monitoring.  General health maintenance Normal colonoscopy result, valid for three years. Advised to schedule eye exam independently. Transitioning to Medicare, may affect healthcare providers. - Schedule eye exam. - Monitor transition to Medicare and adjust healthcare providers as needed.    No follow-ups on file.    Vinary K Tatelyn Vanhecke, MD [facility] Primary Care & Sports Medicine at [facility]   "

## 2024-11-05 ENCOUNTER — Ambulatory Visit: Admitting: Family Medicine

## 2024-11-05 ENCOUNTER — Telehealth: Payer: Self-pay | Admitting: Family Medicine

## 2024-11-05 NOTE — Telephone Encounter (Unsigned)
 Copied from CRM #[id number]. Topic: General - Transportation >> Nov 05, 2024  7:46 AM Tiffini S wrote: Reason for CRM: Patient have transportation service with Medicaid- [employer]- will be late/ they had another patient and their appointment time was longer than expected/ unsure of the arrival time- explained 10 minute grace period for appointments- patient will call the driver back to update

## 2024-11-09 ENCOUNTER — Ambulatory Visit: Admitting: Family Medicine

## 2024-11-12 ENCOUNTER — Ambulatory Visit: Admitting: Family Medicine

## 2024-11-12 ENCOUNTER — Encounter: Payer: Self-pay | Admitting: Family Medicine

## 2024-11-12 VITALS — BP 124/76 | HR 83 | Ht 67.0 in | Wt 181.0 lb

## 2024-11-12 DIAGNOSIS — G8929 Other chronic pain: Secondary | ICD-10-CM | POA: Insufficient documentation

## 2024-11-12 DIAGNOSIS — M542 Cervicalgia: Secondary | ICD-10-CM

## 2024-11-12 DIAGNOSIS — M7071 Other bursitis of hip, right hip: Secondary | ICD-10-CM | POA: Insufficient documentation

## 2024-11-12 DIAGNOSIS — M67432 Ganglion, left wrist: Secondary | ICD-10-CM | POA: Diagnosis not present

## 2024-11-12 DIAGNOSIS — M25561 Pain in right knee: Secondary | ICD-10-CM | POA: Diagnosis not present

## 2024-11-12 DIAGNOSIS — M545 Low back pain, unspecified: Secondary | ICD-10-CM | POA: Diagnosis not present

## 2024-11-12 MED ORDER — CELECOXIB 200 MG PO CAPS: ORAL_CAPSULE | ORAL | 0 refills | Status: DC

## 2024-11-12 MED ORDER — CYCLOBENZAPRINE HCL 10 MG PO TABS: 10.0000 mg | ORAL_TABLET | Freq: Three times a day (TID) | ORAL | 0 refills | Status: AC | PRN

## 2024-11-12 NOTE — Assessment & Plan Note (Signed)
 Phillip Mcdaniel

## 2024-11-12 NOTE — Progress Notes (Signed)
 "    Primary Care / Sports Medicine Office Visit  Patient Information:  Patient ID: [name], male DOB: 1971/03/04 Age: 54 y.o. MRN: [id number]   Phillip Mcdaniel is a pleasant 54 y.o. male presenting with the following:  Chief Complaint  Patient presents with   Hip Pain    Right hip pain radiates into right knee for a long time. Over the last month has become daily. Right hip replacement 5 + years ago. No treatment.    Knee Pain    Right knee pain x 1 month. Having trouble going from sit to stand and starting to take off walking. Patient has to take his time. No treatment. NKI. Constant Dull ache.    Vitals:   11/12/24 0805  BP: 124/76  Pulse: 83  SpO2: 97%   Vitals:   11/12/24 0805  Weight: 181 lb (82.1 kg)  Height: 5' 7 (1.702 m)   Body mass index is 28.35 kg/m.  No results found.   Independent interpretation of notes and tests performed by another provider:   None  Procedures performed:   None  Pertinent History, Exam, Impression, and Recommendations:   Discussed the use of AI scribe software for clinical note transcription with the patient, who gave verbal consent to proceed.  History of Present Illness   Phillip Mcdaniel is a 54 year old male with prior right hip replacement, cervical and lumbar spinal hardware, and left wrist ganglion cyst who presents for evaluation of worsening right hip and right knee pain.  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 - No recent trauma  Left Wrist Ganglion Cyst: - Persistent left wrist ganglion cyst, previously evaluated for neurovascular involvement and risk to blood flow with intervention - Underwent silicone or gel-like procedure approximately two months ago, resulting in partial symptomatic relief - Intermittent severe pain persists; cyst remains enlarged but is less symptomatic than prior to procedure - No recent interventions since last procedure  Chronic Neck and Low Back Pain with Neurological Symptoms: - Chronic neck and low back pain despite prior cervical and lumbar spine surgeries with hardware placement - Numbness and paresthesia in the left elbow - Prior lumbar epidural injections before surgery with minimal benefit  Muscle Spasms and Cramps: - Frequent muscle spasms and cramping, predominantly in the calves - Most prominent in the mornings but can occur at any time - Relieved by manual stretching or dorsiflexion of the foot - Poor sleep attributed to pain and muscle spasms - Not currently taking prescription medications or magnesium supplements for these symptoms     Physical Exam  RIGHT HIP INSPECTION: No deformity, swelling, or erythema. Normal pelvic alignment. PALPATION: Non-tender at the right greater trochanter and sacroiliac joint. No focal groin tenderness. RANGE OF MOTION: Hip flexion, extension, abduction, adduction, and rotation preserved. STRENGTH: Resisted right iliopsoas testing reproduces pain at the anterolateral right hip; otherwise hip strength 5/5. NEUROLOGICAL: Sensation intact to light touch in  the right lower extremity; no focal motor deficit. SPECIAL TESTS: Positive FADIR test on the right hip.  RIGHT KNEE INSPECTION: No deformity, erythema, or effusion. PALPATION: Non-tender medial joint line. Minimal tenderness at the lateral joint line. Tenderness at the superior pole of the patella  and quadriceps tendon. Patellar tendon non-tender. RANGE OF MOTION: 0-130 degrees with minimally painful terminal flexion. STRENGTH: Quadriceps and hamstring strength preserved. NEUROLOGICAL: Sensation intact to light touch in the right lower extremity. SPECIAL TESTS: Negative anterior and posterior drawer tests. Negative valgus and varus stress tests.  Assessment and Plan    Right iliopsoas tendinitis and right knee arthralgia Chronic right hip and knee pain post-hip replacement, worsened over six weeks. Likely due to overuse and compensatory mechanisms. Limits ambulation and activities. - Prescribed celecoxib  200 mg twice daily with food for two weeks, then as needed. - Prescribed cyclobenzaprine  up to three times daily as needed, with drowsiness precautions. - Recommended magnesium glycinate at night for muscle function and sleep. - Provided home exercise program for hip and knee, to start after two weeks of medication, perform weekly. - Advised medication continuation for up to one month, use as needed for exercise-induced pain. - Instructed to contact office if no improvement after one month or significant worsening for further interventions.  Chronic neck and low back pain with spinal hardware Persistent cervical and lumbar pain post-surgery with hardware. Hip and knee medications may benefit neck and back. - Advised celecoxib  and cyclobenzaprine  may alleviate neck and back pain. - Discussed referral to [facility] for non-surgical interventions if pain persists or worsens.  Left wrist ganglion cyst Recurrent cyst with neurovascular involvement, limiting surgical options. - Instructed to contact office for procedure visit if cyst becomes more symptomatic or requires aspiration.  Muscle spasms and cramps Frequent spasms and cramps, likely due to overuse, possible magnesium deficiency, and chronic pain. - Recommended magnesium glycinate at night to reduce cramps and  support sleep. - Prescribed cyclobenzaprine  up to three times daily as needed, emphasizing nighttime dosing.        Assessment & Plan Iliopsoas bursitis of right hip     Arthralgia of right knee     Ganglion cyst of volar aspect of left wrist     Chronic primary pain of cervical spine     Chronic lumbosacral pain      No follow-ups on file.     Selinda JINNY Ku, MD, [facility]   Primary Care Sports Medicine Primary Care and Sports Medicine at [facility]   "

## 2024-11-12 NOTE — Assessment & Plan Note (Signed)
 SABRA

## 2024-11-12 NOTE — Patient Instructions (Signed)
" °  VISIT SUMMARY: Today, you were evaluated for worsening right hip and knee pain, a left wrist ganglion cyst, chronic neck and low back pain, and muscle spasms and cramps. We discussed treatment options and provided prescriptions to help manage your symptoms.  YOUR PLAN: RIGHT HIP AND RIGHT KNEE PAIN: Chronic pain in your right hip and knee, worsened over the past six weeks, likely due to overuse and compensatory mechanisms. -Take celecoxib  200 mg twice daily with food for two weeks, then as needed. -Take cyclobenzaprine  up to three times daily as needed, with precautions for drowsiness. -Take magnesium glycinate at night to support muscle function and sleep. -Follow a home exercise program for your hip and knee, starting after two weeks of medication, and perform weekly. -Continue medication for up to one month and use as needed for exercise-induced pain. -Contact our office if there is no improvement after one month or if symptoms significantly worsen.  CHRONIC NECK AND LOW BACK PAIN WITH SPINAL HARDWARE: Persistent neck and low back pain despite prior surgeries with hardware placement. -Take celecoxib  and cyclobenzaprine  as prescribed for your hip and knee pain, as they may also help alleviate your neck and back pain. -Consider a referral to [facility] for non-surgical interventions if the pain persists or worsens.  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 -Contact our office for a procedure visit if the cyst becomes more symptomatic or requires aspiration.  MUSCLE SPASMS AND CRAMPS: Frequent spasms and cramps, likely due to overuse, possible magnesium deficiency, and chronic pain. -Take magnesium glycinate at night to reduce cramps and support sleep. -Take cyclobenzaprine  up to three times daily as needed, emphasizing nighttime dosing.                      Contains text generated by Abridge.                                  Contains text generated by Abridge.   "

## 2024-11-13 ENCOUNTER — Ambulatory Visit: Attending: Family Medicine

## 2024-11-19 ENCOUNTER — Ambulatory Visit

## 2024-11-19 DIAGNOSIS — R001 Bradycardia, unspecified: Secondary | ICD-10-CM | POA: Diagnosis not present

## 2024-11-19 DIAGNOSIS — R011 Cardiac murmur, unspecified: Secondary | ICD-10-CM | POA: Diagnosis not present

## 2024-11-19 LAB — ECHOCARDIOGRAM COMPLETE
AR max vel: 1.95 cm2
AV Area VTI: 2.19 cm2
AV Area mean vel: 2.01 cm2
AV Mean grad: 8 mmHg
AV Peak grad: 16.3 mmHg
Ao pk vel: 2.02 m/s
Area-P 1/2: 3.99 cm2
S' Lateral: 3.01 cm

## 2024-12-02 ENCOUNTER — Other Ambulatory Visit: Payer: Self-pay | Admitting: Family Medicine

## 2024-12-02 DIAGNOSIS — G8929 Other chronic pain: Secondary | ICD-10-CM

## 2024-12-02 DIAGNOSIS — M25561 Pain in right knee: Secondary | ICD-10-CM

## 2024-12-02 DIAGNOSIS — M7071 Other bursitis of hip, right hip: Secondary | ICD-10-CM

## 2024-12-02 NOTE — Telephone Encounter (Signed)
 Copied from CRM #[id number]. Topic: Clinical - Medication Refill >> Dec 02, 2024  1:21 PM Victoria B wrote: Medication: cyclobenzaprine  (FLEXERIL ) 10 MG tablet /celecoxib  (CELEBREX ) 200 MG capsule  Has the patient contacted their pharmacy? Pharmacy contacted patient (Agent: If no, request that the patient contact the pharmacy for the refill. If patient does not wish to contact the pharmacy document the reason why and proceed with request.) (Agent: If yes, when and what did the pharmacy advise?)contact pcp  This is the patient's preferred pharmacy:   [id number] [place], [place] - [address] AT [facility] [address] [place] [address] [place] [address] Phone: [phone] Fax: [phone]  [id number] [place], [place] - [address] [place] [address] Phone: [phone] Fax: [phone]  Is this the correct pharmacy for this prescription? yes   Has the prescription been filled recently? no  Is the patient out of the medication? Has 1-2 left  Has the patient been seen for an appointment in the last year OR does the patient have an upcoming appointment? yes  Can we respond through MyChart? no  Agent: Please be advised that Rx refills may take up to 3 business days. We ask that you follow-up with your pharmacy.

## 2024-12-03 ENCOUNTER — Other Ambulatory Visit: Payer: Self-pay | Admitting: Family Medicine

## 2024-12-03 DIAGNOSIS — M542 Cervicalgia: Secondary | ICD-10-CM

## 2024-12-03 DIAGNOSIS — M7071 Other bursitis of hip, right hip: Secondary | ICD-10-CM

## 2024-12-03 DIAGNOSIS — M25561 Pain in right knee: Secondary | ICD-10-CM

## 2024-12-03 DIAGNOSIS — G8929 Other chronic pain: Secondary | ICD-10-CM

## 2024-12-04 ENCOUNTER — Other Ambulatory Visit: Payer: Self-pay | Admitting: Family Medicine

## 2024-12-04 ENCOUNTER — Other Ambulatory Visit: Payer: Self-pay

## 2024-12-04 DIAGNOSIS — M545 Low back pain, unspecified: Secondary | ICD-10-CM

## 2024-12-04 DIAGNOSIS — M25561 Pain in right knee: Secondary | ICD-10-CM

## 2024-12-04 DIAGNOSIS — M7071 Other bursitis of hip, right hip: Secondary | ICD-10-CM

## 2024-12-04 DIAGNOSIS — G8929 Other chronic pain: Secondary | ICD-10-CM

## 2024-12-04 MED ORDER — CELECOXIB 200 MG PO CAPS: ORAL_CAPSULE | ORAL | 0 refills | Status: DC

## 2024-12-04 MED ORDER — CELECOXIB 200 MG PO CAPS: ORAL_CAPSULE | ORAL | 0 refills | Status: AC

## 2024-12-04 NOTE — Telephone Encounter (Signed)
 Requested Prescriptions  Pending Prescriptions Disp Refills   cyclobenzaprine  (FLEXERIL ) 10 MG tablet 60 tablet     Sig: Take 1 tablet (10 mg total) by mouth 3 (three) times daily as needed for muscle spasms.     Not Delegated - Analgesics:  Muscle Relaxants Failed - 12/04/2024 10:54 AM      Failed - This refill cannot be delegated      Passed - Valid encounter within last 6 months    Recent Outpatient Visits           3 weeks ago Iliopsoas bursitis of right hip   [place] [facility], Selinda PARAS, MD   1 month ago Encounter for lipid screening for cardiovascular disease   [place] [facility] at [facility], Vinay K, MD   2 months ago Uncontrolled hypertension   [place] [facility] at [facility], Vinay K, MD   4 months ago Ganglion cyst of volar aspect of left wrist   [place] [facility] at [facility], Selinda PARAS, MD   4 months ago Uncontrolled hypertension   [facility] at [facility], Vinay K, MD               celecoxib  (CELEBREX ) 200 MG capsule 60 capsule 0    Sig: One to 2 tablets by mouth daily as needed for pain.     Analgesics:  COX2 Inhibitors Failed - 12/04/2024 10:54 AM      Failed - Manual Review: Labs are only required if the patient has taken medication for more than 8 weeks.      Passed - HGB in normal range and within 360 days    Hemoglobin  Date Value Ref Range Status  03/04/2024 17.0 13.0 - 17.0 g/dL Final   HGB  Date Value Ref Range Status  11/23/2011 11.6 (L) 13.0 - 18.0 g/dL Final         Passed - Cr in normal range and within 360 days    Creatinine  Date Value Ref Range Status  11/23/2011 1.23 0.60 - 1.30 mg/dL Final   Creatinine, Ser  Date Value Ref Range Status  03/04/2024 1.04 0.61 - 1.24 mg/dL Final         Passed - HCT in normal range and  within 360 days    HCT  Date Value Ref Range Status  03/04/2024 49.3 39.0 - 52.0 % Final  11/23/2011 34.3 (L) 40.0 - 52.0 % Final         Passed - AST in normal range and within 360 days    AST  Date Value Ref Range Status  03/04/2024 22 15 - 41 U/L Final         Passed - ALT in normal range and within 360 days    ALT  Date Value Ref Range Status  03/04/2024 23 0 - 44 U/L Final         Passed - eGFR is 30 or above and within 360 days    EGFR (African American)  Date Value Ref Range Status  11/23/2011 >60 >102mL/min Final   GFR calc Af Amer  Date Value Ref Range Status  05/05/2020 >60 >60 mL/min Final   EGFR (Non-African Amer.)  Date Value Ref Range Status  11/23/2011 >60 >72mL/min Final    Comment:    eGFR values <71mL/min/1.73 m2  may be an indication of chronic kidney disease (CKD). Calculated eGFR, using the MRDR Study equation, is useful in  patients with stable renal function. The eGFR calculation will not be reliable in acutely ill patients when serum creatinine is changing rapidly. It is not useful in patients on dialysis. The eGFR calculation may not be applicable to patients at the low and high extremes of body sizes, pregnant women, and vegetarians.    GFR, Estimated  Date Value Ref Range Status  03/04/2024 >60 >60 mL/min Final    Comment:    (NOTE) Calculated using the CKD-EPI Creatinine Equation (2021)          Passed - Patient is not pregnant      Passed - Valid encounter within last 12 months    Recent Outpatient Visits           3 weeks ago Iliopsoas bursitis of right hip   [place] [facility], Selinda PARAS, MD   1 month ago Encounter for lipid screening for cardiovascular disease   [place] [facility] at [facility], Vinay K, MD   2 months ago Uncontrolled hypertension   [place] [facility] at [facility], Vinay K, MD   4  months ago Ganglion cyst of volar aspect of left wrist   [facility] at [facility], Selinda PARAS, MD   4 months ago Uncontrolled hypertension   [facility] at [facility], Vinay K, MD

## 2024-12-04 NOTE — Telephone Encounter (Signed)
 Requested medication (s) are due for refill today: yes  Requested medication (s) are on the active medication list: yes  Last refill:  07/28/24 #15  Future visit scheduled: no  Notes to clinic:  med not delegated to NT to RF   Requested Prescriptions  Pending Prescriptions Disp Refills   cyclobenzaprine  (FLEXERIL ) 10 MG tablet 60 tablet     Sig: Take 1 tablet (10 mg total) by mouth 3 (three) times daily as needed for muscle spasms.     Not Delegated - Analgesics:  Muscle Relaxants Failed - 12/04/2024 10:54 AM      Failed - This refill cannot be delegated      Passed - Valid encounter within last 6 months    Recent Outpatient Visits           3 weeks ago Iliopsoas bursitis of right hip   [place] [facility], Selinda PARAS, MD   1 month ago Encounter for lipid screening for cardiovascular disease   [place] [facility] at [facility], Vinay K, MD   2 months ago Uncontrolled hypertension   [place] [facility] at [facility], Vinay K, MD   4 months ago Ganglion cyst of volar aspect of left wrist   [place] [facility] at [facility], Selinda PARAS, MD   4 months ago Uncontrolled hypertension   [facility] at [facility], Vinay K, MD              Signed Prescriptions Disp Refills   celecoxib  (CELEBREX ) 200 MG capsule 60 capsule 0    Sig: One to 2 tablets by mouth daily as needed for pain.     Analgesics:  COX2 Inhibitors Failed - 12/04/2024 10:54 AM      Failed - Manual Review: Labs are only required if the patient has taken medication for more than 8 weeks.      Passed - HGB in normal range and within 360 days    Hemoglobin  Date Value Ref Range Status  03/04/2024 17.0 13.0 - 17.0 g/dL Final   HGB  Date Value Ref Range Status  11/23/2011 11.6 (L) 13.0 - 18.0 g/dL Final          Passed - Cr in normal range and within 360 days    Creatinine  Date Value Ref Range Status  11/23/2011 1.23 0.60 - 1.30 mg/dL Final   Creatinine, Ser  Date Value Ref Range Status  03/04/2024 1.04 0.61 - 1.24 mg/dL Final         Passed - HCT in normal range and within 360 days    HCT  Date Value Ref Range Status  03/04/2024 49.3 39.0 - 52.0 % Final  11/23/2011 34.3 (L) 40.0 - 52.0 % Final         Passed - AST in normal range and within 360 days    AST  Date Value Ref Range Status  03/04/2024 22 15 - 41 U/L Final         Passed - ALT in normal range and within 360 days    ALT  Date Value Ref Range Status  03/04/2024 23 0 - 44 U/L Final         Passed - eGFR is 30 or above and within 360 days    EGFR (African American)  Date Value Ref Range Status  11/23/2011 >60 >32mL/min Final   GFR calc Af Amer  Date Value Ref Range Status  05/05/2020 >60 >60 mL/min Final   EGFR (Non-African Amer.)  Date Value Ref Range Status  11/23/2011 >60 >33mL/min Final    Comment:    eGFR values <47mL/min/1.73 m2 may be an indication of chronic kidney disease (CKD). Calculated eGFR, using the MRDR Study equation, is useful in  patients with stable renal function. The eGFR calculation will not be reliable in acutely ill patients when serum creatinine is changing rapidly. It is not useful in patients on dialysis. The eGFR calculation may not be applicable to patients at the low and high extremes of body sizes, pregnant women, and vegetarians.    GFR, Estimated  Date Value Ref Range Status  03/04/2024 >60 >60 mL/min Final    Comment:    (NOTE) Calculated using the CKD-EPI Creatinine Equation (2021)          Passed - Patient is not pregnant      Passed - Valid encounter within last 12 months    Recent Outpatient Visits           3 weeks ago Iliopsoas bursitis of right hip   [place] [facility], Selinda PARAS, MD   1 month ago  Encounter for lipid screening for cardiovascular disease   [place] [facility] at [facility], Vinay K, MD   2 months ago Uncontrolled hypertension    [facility] Medicine at [facility], Vinay K, MD   4 months ago Ganglion cyst of volar aspect of left wrist   [facility] at [facility], Selinda PARAS, MD   4 months ago Uncontrolled hypertension   [facility] at [facility], Vinay K, MD

## 2024-12-04 NOTE — Telephone Encounter (Signed)
 Requested medication (s) are due for refill today - yes  Requested medication (s) are on the active medication list -yes  Future visit scheduled -yes  Last refill: 11/12/24 #60  Notes to clinic: non delegated Rx  Requested Prescriptions  Pending Prescriptions Disp Refills   cyclobenzaprine  (FLEXERIL ) 10 MG tablet [Pharmacy Med Name: CYCLOBENZAPRINE  10 MG TABLET] 60 tablet 0    Sig: TAKE 1 TABLET BY MOUTH THREE TIMES A DAY AS NEEDED FOR MUSCLE SPASMS     Not Delegated - Analgesics:  Muscle Relaxants Failed - 12/04/2024  3:50 PM      Failed - This refill cannot be delegated      Passed - Valid encounter within last 6 months    Recent Outpatient Visits           3 weeks ago Iliopsoas bursitis of right hip   [place] [facility], Selinda PARAS, MD   1 month ago Encounter for lipid screening for cardiovascular disease   [place] [facility] at [facility], Vinay K, MD   2 months ago Uncontrolled hypertension   [place] [facility] at [facility], Vinay K, MD   4 months ago Ganglion cyst of volar aspect of left wrist   [place] [facility] at [facility], Selinda PARAS, MD   4 months ago Uncontrolled hypertension   [facility] at [facility], Vinay K, MD              Refused Prescriptions Disp Refills   celecoxib  (CELEBREX ) 200 MG capsule [Pharmacy Med Name: CELECOXIB  200 MG CAPSULE] 60 capsule 0    Sig: TAKE 1 TO 2 TABLETS BY MOUTH DAILY AS NEEDED FOR PAIN.     Analgesics:  COX2 Inhibitors Failed - 12/04/2024  3:50 PM      Failed - Manual Review: Labs are only required if the patient has taken medication for more than 8 weeks.      Passed - HGB in normal range and within 360 days    Hemoglobin  Date Value Ref Range Status  03/04/2024 17.0 13.0 - 17.0 g/dL Final   HGB  Date Value Ref  Range Status  11/23/2011 11.6 (L) 13.0 - 18.0 g/dL Final         Passed - Cr in normal range and within 360 days    Creatinine  Date Value Ref Range Status  11/23/2011 1.23 0.60 - 1.30 mg/dL Final   Creatinine, Ser  Date Value Ref Range Status  03/04/2024 1.04 0.61 - 1.24 mg/dL Final         Passed - HCT in normal range and within 360 days    HCT  Date Value Ref Range Status  03/04/2024 49.3 39.0 - 52.0 % Final  11/23/2011 34.3 (L) 40.0 - 52.0 % Final         Passed - AST in normal range and within 360 days    AST  Date Value Ref Range Status  03/04/2024 22 15 - 41 U/L Final         Passed - ALT in normal range and within 360 days    ALT  Date Value Ref Range Status  03/04/2024 23 0 - 44 U/L Final         Passed - eGFR is 30 or above and within 360 days    EGFR (  African American)  Date Value Ref Range Status  11/23/2011 >60 >9mL/min Final   GFR calc Af Amer  Date Value Ref Range Status  05/05/2020 >60 >60 mL/min Final   EGFR (Non-African Amer.)  Date Value Ref Range Status  11/23/2011 >60 >11mL/min Final    Comment:    eGFR values <20mL/min/1.73 m2 may be an indication of chronic kidney disease (CKD). Calculated eGFR, using the MRDR Study equation, is useful in  patients with stable renal function. The eGFR calculation will not be reliable in acutely ill patients when serum creatinine is changing rapidly. It is not useful in patients on dialysis. The eGFR calculation may not be applicable to patients at the low and high extremes of body sizes, pregnant women, and vegetarians.    GFR, Estimated  Date Value Ref Range Status  03/04/2024 >60 >60 mL/min Final    Comment:    (NOTE) Calculated using the CKD-EPI Creatinine Equation (2021)          Passed - Patient is not pregnant      Passed - Valid encounter within last 12 months    Recent Outpatient Visits           3 weeks ago Iliopsoas bursitis of right hip   [place] [facility] at [facility], Selinda PARAS, MD   1 month ago Encounter for lipid screening for cardiovascular disease   [place] [facility] at [facility], Vinay K, MD   2 months ago Uncontrolled hypertension   [place] [facility] at [facility], Vinay K, MD   4 months ago Ganglion cyst of volar aspect of left wrist   [place] [facility] at [facility], Selinda PARAS, MD   4 months ago Uncontrolled hypertension   [place] [facility] at [facility], Vinay K, MD                 Requested Prescriptions  Pending Prescriptions Disp Refills   cyclobenzaprine  (FLEXERIL ) 10 MG tablet [Pharmacy Med Name: CYCLOBENZAPRINE  10 MG TABLET] 60 tablet 0    Sig: TAKE 1 TABLET BY MOUTH THREE TIMES A DAY AS NEEDED FOR MUSCLE SPASMS     Not Delegated - Analgesics:  Muscle Relaxants Failed - 12/04/2024  3:50 PM      Failed - This refill cannot be delegated      Passed - Valid encounter within last 6 months    Recent Outpatient Visits           3 weeks ago Iliopsoas bursitis of right hip   [place] [facility] at [facility], Selinda PARAS, MD   1 month ago Encounter for lipid screening for cardiovascular disease   [place] [facility] at [facility], Vinay K, MD   2 months ago Uncontrolled hypertension   [place] [facility] at [facility], Vinay K, MD   4 months ago Ganglion cyst of volar aspect of left wrist    [facility] Medicine at [facility], Selinda PARAS, MD   4 months ago Uncontrolled hypertension   [facility] at [facility], Vinay K, MD              Refused Prescriptions Disp Refills   celecoxib  (CELEBREX ) 200 MG  capsule [Pharmacy Med Name: CELECOXIB  200 MG CAPSULE] 60  capsule 0    Sig: TAKE 1 TO 2 TABLETS BY MOUTH DAILY AS NEEDED FOR PAIN.     Analgesics:  COX2 Inhibitors Failed - 12/04/2024  3:50 PM      Failed - Manual Review: Labs are only required if the patient has taken medication for more than 8 weeks.      Passed - HGB in normal range and within 360 days    Hemoglobin  Date Value Ref Range Status  03/04/2024 17.0 13.0 - 17.0 g/dL Final   HGB  Date Value Ref Range Status  11/23/2011 11.6 (L) 13.0 - 18.0 g/dL Final         Passed - Cr in normal range and within 360 days    Creatinine  Date Value Ref Range Status  11/23/2011 1.23 0.60 - 1.30 mg/dL Final   Creatinine, Ser  Date Value Ref Range Status  03/04/2024 1.04 0.61 - 1.24 mg/dL Final         Passed - HCT in normal range and within 360 days    HCT  Date Value Ref Range Status  03/04/2024 49.3 39.0 - 52.0 % Final  11/23/2011 34.3 (L) 40.0 - 52.0 % Final         Passed - AST in normal range and within 360 days    AST  Date Value Ref Range Status  03/04/2024 22 15 - 41 U/L Final         Passed - ALT in normal range and within 360 days    ALT  Date Value Ref Range Status  03/04/2024 23 0 - 44 U/L Final         Passed - eGFR is 30 or above and within 360 days    EGFR (African American)  Date Value Ref Range Status  11/23/2011 >60 >73mL/min Final   GFR calc Af Amer  Date Value Ref Range Status  05/05/2020 >60 >60 mL/min Final   EGFR (Non-African Amer.)  Date Value Ref Range Status  11/23/2011 >60 >7mL/min Final    Comment:    eGFR values <73mL/min/1.73 m2 may be an indication of chronic kidney disease (CKD). Calculated eGFR, using the MRDR Study equation, is useful in  patients with stable renal function. The eGFR calculation will not be reliable in acutely ill patients when serum creatinine is changing rapidly. It is not useful in patients on dialysis. The eGFR calculation may not be applicable to patients at the low and high extremes of body sizes,  pregnant women, and vegetarians.    GFR, Estimated  Date Value Ref Range Status  03/04/2024 >60 >60 mL/min Final    Comment:    (NOTE) Calculated using the CKD-EPI Creatinine Equation (2021)          Passed - Patient is not pregnant      Passed - Valid encounter within last 12 months    Recent Outpatient Visits           3 weeks ago Iliopsoas bursitis of right hip   [place] [facility] at [facility], Selinda PARAS, MD   1 month ago Encounter for lipid screening for cardiovascular disease   [facility] at [facility], Vinay K, MD   2 months ago Uncontrolled hypertension   [facility] at [facility], Vinay K, MD   4 months ago Ganglion cyst  of volar aspect of left wrist   [place] [facility] at [facility], Selinda PARAS, MD   4 months ago Uncontrolled hypertension   [facility] at [facility], Vinay K, MD

## 2024-12-04 NOTE — Telephone Encounter (Signed)
 You approved the CELEBREX  but he's asking for another prescription. Please review.Thank you LB

## 2024-12-04 NOTE — Telephone Encounter (Signed)
 Copied from CRM [id number]. Topic: Clinical - Prescription Issue >> Dec 04, 2024 12:51 PM Santiya F wrote: Reason for CRM: Patient is calling in because his medication celecoxib  (CELEBREX ) 200 MG capsule [[id number]] was sent to [facility], and he wants it sent to [facility] [address] [place].

## 2024-12-04 NOTE — Telephone Encounter (Signed)
 Duplicate request- filled 12/04/24 Requested Prescriptions  Pending Prescriptions Disp Refills   celecoxib  (CELEBREX ) 200 MG capsule [Pharmacy Med Name: CELECOXIB  200 MG CAPSULE] 60 capsule 0    Sig: TAKE 1 TO 2 TABLETS BY MOUTH DAILY AS NEEDED FOR PAIN.     Analgesics:  COX2 Inhibitors Failed - 12/04/2024  3:49 PM      Failed - Manual Review: Labs are only required if the patient has taken medication for more than 8 weeks.      Passed - HGB in normal range and within 360 days    Hemoglobin  Date Value Ref Range Status  03/04/2024 17.0 13.0 - 17.0 g/dL Final   HGB  Date Value Ref Range Status  11/23/2011 11.6 (L) 13.0 - 18.0 g/dL Final         Passed - Cr in normal range and within 360 days    Creatinine  Date Value Ref Range Status  11/23/2011 1.23 0.60 - 1.30 mg/dL Final   Creatinine, Ser  Date Value Ref Range Status  03/04/2024 1.04 0.61 - 1.24 mg/dL Final         Passed - HCT in normal range and within 360 days    HCT  Date Value Ref Range Status  03/04/2024 49.3 39.0 - 52.0 % Final  11/23/2011 34.3 (L) 40.0 - 52.0 % Final         Passed - AST in normal range and within 360 days    AST  Date Value Ref Range Status  03/04/2024 22 15 - 41 U/L Final         Passed - ALT in normal range and within 360 days    ALT  Date Value Ref Range Status  03/04/2024 23 0 - 44 U/L Final         Passed - eGFR is 30 or above and within 360 days    EGFR (African American)  Date Value Ref Range Status  11/23/2011 >60 >22mL/min Final   GFR calc Af Amer  Date Value Ref Range Status  05/05/2020 >60 >60 mL/min Final   EGFR (Non-African Amer.)  Date Value Ref Range Status  11/23/2011 >60 >56mL/min Final    Comment:    eGFR values <13mL/min/1.73 m2 may be an indication of chronic kidney disease (CKD). Calculated eGFR, using the MRDR Study equation, is useful in  patients with stable renal function. The eGFR calculation will not be reliable in acutely ill patients when serum  creatinine is changing rapidly. It is not useful in patients on dialysis. The eGFR calculation may not be applicable to patients at the low and high extremes of body sizes, pregnant women, and vegetarians.    GFR, Estimated  Date Value Ref Range Status  03/04/2024 >60 >60 mL/min Final    Comment:    (NOTE) Calculated using the CKD-EPI Creatinine Equation (2021)          Passed - Patient is not pregnant      Passed - Valid encounter within last 12 months    Recent Outpatient Visits           3 weeks ago Iliopsoas bursitis of right hip   [facility], Selinda PARAS, MD   1 month ago Encounter for lipid screening for cardiovascular disease   [place], Vinay K, MD   2 months ago Uncontrolled hypertension   [place] [facility], Vinay K, MD   4 months ago Ganglion cyst of volar aspect of left wrist   [facility], Selinda PARAS, MD   4 months ago Uncontrolled hypertension   [facility] at [facility], Vinay K, MD               cyclobenzaprine  (FLEXERIL ) 10 MG tablet [Pharmacy Med Name: CYCLOBENZAPRINE  10 MG TABLET] 60 tablet 0    Sig: TAKE 1 TABLET BY MOUTH THREE TIMES A DAY AS NEEDED FOR MUSCLE SPASMS     Not Delegated - Analgesics:  Muscle Relaxants Failed - 12/04/2024  3:49 PM      Failed - This refill cannot be delegated      Passed - Valid encounter within last 6 months    Recent Outpatient Visits           3 weeks ago Iliopsoas bursitis of right hip   [facility], Selinda PARAS, MD   1 month ago Encounter for lipid screening for cardiovascular disease   [place] [facility], Vinay K, MD   2 months ago  Uncontrolled hypertension   [place] [facility], Vinay K, MD   4 months ago Ganglion cyst of volar aspect of left wrist   [facility] at [facility], Selinda PARAS, MD   4 months ago Uncontrolled hypertension   [facility] at [facility], Vinay K, MD

## 2024-12-04 NOTE — Telephone Encounter (Signed)
 Copied from CRM #[id number]. Topic: Clinical - Prescription Issue >> Dec 04, 2024 12:44 PM Hadassah PARAS wrote: Reason for CRM: Pt states he will be picking up celecoxib  (CELEBREX ) 200 MG capsule from [facility] since it is ready for pick up. Please continue to send cyclobenzaprine  (FLEXERIL ) 10 MG tablet to [facility] as shown on the last refill req.

## 2025-05-04 ENCOUNTER — Ambulatory Visit: Admitting: Family Medicine
